# Patient Record
Sex: Female | Born: 1960
Health system: Southern US, Community
[De-identification: ages and names within clinical notes are randomized; demographics above are authoritative.]

## PROBLEM LIST (undated history)

## (undated) DIAGNOSIS — T7840XA Allergy, unspecified, initial encounter: Secondary | ICD-10-CM

## (undated) DIAGNOSIS — D649 Anemia, unspecified: Secondary | ICD-10-CM

## (undated) DIAGNOSIS — H409 Unspecified glaucoma: Secondary | ICD-10-CM

## (undated) HISTORY — DX: Unspecified glaucoma: H40.9

## (undated) HISTORY — DX: Anemia, unspecified: D64.9

## (undated) HISTORY — PX: MOUTH SURGERY: SHX715

## (undated) HISTORY — DX: Allergy, unspecified, initial encounter: T78.40XA

---

## 1994-02-12 HISTORY — PX: OOPHORECTOMY: SHX86

## 2002-02-12 HISTORY — PX: KNEE ARTHROSCOPY: SUR90

## 2003-06-04 ENCOUNTER — Other Ambulatory Visit: Admission: RE | Admit: 2003-06-04 | Discharge: 2003-06-04 | Payer: Self-pay | Admitting: Family Medicine

## 2003-06-08 ENCOUNTER — Encounter: Admission: RE | Admit: 2003-06-08 | Discharge: 2003-06-08 | Payer: Self-pay | Admitting: Family Medicine

## 2004-06-08 ENCOUNTER — Encounter: Admission: RE | Admit: 2004-06-08 | Discharge: 2004-06-08 | Payer: Self-pay | Admitting: Obstetrics and Gynecology

## 2004-07-06 ENCOUNTER — Other Ambulatory Visit: Admission: RE | Admit: 2004-07-06 | Discharge: 2004-07-06 | Payer: Self-pay | Admitting: Obstetrics and Gynecology

## 2005-04-30 ENCOUNTER — Ambulatory Visit: Payer: Self-pay | Admitting: Family Medicine

## 2005-06-11 ENCOUNTER — Encounter: Admission: RE | Admit: 2005-06-11 | Discharge: 2005-06-11 | Payer: Self-pay | Admitting: Obstetrics and Gynecology

## 2005-07-16 ENCOUNTER — Ambulatory Visit: Payer: Self-pay | Admitting: Family Medicine

## 2006-06-19 ENCOUNTER — Encounter: Admission: RE | Admit: 2006-06-19 | Discharge: 2006-06-19 | Payer: Self-pay | Admitting: Obstetrics and Gynecology

## 2006-12-23 ENCOUNTER — Ambulatory Visit: Payer: Self-pay | Admitting: Family Medicine

## 2006-12-23 DIAGNOSIS — J069 Acute upper respiratory infection, unspecified: Secondary | ICD-10-CM | POA: Insufficient documentation

## 2006-12-23 DIAGNOSIS — D649 Anemia, unspecified: Secondary | ICD-10-CM | POA: Insufficient documentation

## 2006-12-27 ENCOUNTER — Telehealth: Payer: Self-pay | Admitting: Family Medicine

## 2007-02-21 ENCOUNTER — Telehealth (INDEPENDENT_AMBULATORY_CARE_PROVIDER_SITE_OTHER): Payer: Self-pay | Admitting: *Deleted

## 2007-08-14 ENCOUNTER — Encounter: Admission: RE | Admit: 2007-08-14 | Discharge: 2007-08-14 | Payer: Self-pay | Admitting: Obstetrics and Gynecology

## 2007-09-30 ENCOUNTER — Ambulatory Visit: Payer: Self-pay | Admitting: Family Medicine

## 2007-09-30 LAB — CONVERTED CEMR LAB
Bilirubin Urine: NEGATIVE
Nitrite: NEGATIVE
Specific Gravity, Urine: 1.025
Urobilinogen, UA: 0.2

## 2007-10-01 LAB — CONVERTED CEMR LAB
ALT: 13 units/L (ref 0–35)
Albumin: 4.4 g/dL (ref 3.5–5.2)
BUN: 25 mg/dL — ABNORMAL HIGH (ref 6–23)
Basophils Relative: 1 % (ref 0.0–3.0)
Bilirubin, Direct: 0.1 mg/dL (ref 0.0–0.3)
CO2: 27 meq/L (ref 19–32)
Calcium: 9.2 mg/dL (ref 8.4–10.5)
Cholesterol: 172 mg/dL (ref 0–200)
Creatinine, Ser: 0.9 mg/dL (ref 0.4–1.2)
Eosinophils Relative: 2.7 % (ref 0.0–5.0)
Glucose, Bld: 90 mg/dL (ref 70–99)
HCT: 38.2 % (ref 36.0–46.0)
Hemoglobin: 13 g/dL (ref 12.0–15.0)
LDL Cholesterol: 95 mg/dL (ref 0–99)
Lymphocytes Relative: 30.2 % (ref 12.0–46.0)
Monocytes Relative: 8.3 % (ref 3.0–12.0)
Neutro Abs: 2.9 10*3/uL (ref 1.4–7.7)
RBC: 4.2 M/uL (ref 3.87–5.11)
TSH: 2.04 microintl units/mL (ref 0.35–5.50)
Total CHOL/HDL Ratio: 2.5
Total Protein: 7 g/dL (ref 6.0–8.3)

## 2008-07-01 ENCOUNTER — Telehealth (INDEPENDENT_AMBULATORY_CARE_PROVIDER_SITE_OTHER): Payer: Self-pay | Admitting: *Deleted

## 2008-09-30 ENCOUNTER — Encounter: Admission: RE | Admit: 2008-09-30 | Discharge: 2008-09-30 | Payer: Self-pay | Admitting: Obstetrics and Gynecology

## 2008-10-06 ENCOUNTER — Ambulatory Visit: Payer: Self-pay | Admitting: Family Medicine

## 2008-11-10 ENCOUNTER — Ambulatory Visit: Payer: Self-pay | Admitting: Family Medicine

## 2008-11-10 DIAGNOSIS — J019 Acute sinusitis, unspecified: Secondary | ICD-10-CM

## 2008-11-12 ENCOUNTER — Ambulatory Visit: Payer: Self-pay | Admitting: Family Medicine

## 2008-11-12 LAB — CONVERTED CEMR LAB
Glucose, Urine, Semiquant: NEGATIVE
Protein, U semiquant: NEGATIVE
WBC Urine, dipstick: NEGATIVE
pH: 7

## 2008-11-16 LAB — CONVERTED CEMR LAB
ALT: 15 units/L (ref 0–35)
AST: 20 units/L (ref 0–37)
Albumin: 4.4 g/dL (ref 3.5–5.2)
Basophils Relative: 0.3 % (ref 0.0–3.0)
Eosinophils Relative: 1.3 % (ref 0.0–5.0)
GFR calc non Af Amer: 80.85 mL/min (ref >60)
Glucose, Bld: 92 mg/dL (ref 70–99)
HCT: 37.3 % (ref 36.0–46.0)
Hemoglobin: 12.7 g/dL (ref 12.0–15.0)
Lymphs Abs: 1 10*3/uL (ref 0.7–4.0)
Monocytes Relative: 6.8 % (ref 3.0–12.0)
Neutro Abs: 2.9 10*3/uL (ref 1.4–7.7)
Potassium: 4.5 meq/L (ref 3.5–5.1)
RDW: 11.8 % (ref 11.5–14.6)
Sodium: 142 meq/L (ref 135–145)
TSH: 1.08 microintl units/mL (ref 0.35–5.50)
VLDL: 7.6 mg/dL (ref 0.0–40.0)
WBC: 4.3 10*3/uL — ABNORMAL LOW (ref 4.5–10.5)

## 2008-11-24 ENCOUNTER — Ambulatory Visit: Payer: Self-pay | Admitting: Family Medicine

## 2009-03-02 ENCOUNTER — Ambulatory Visit: Payer: Self-pay | Admitting: Family Medicine

## 2009-03-02 DIAGNOSIS — J309 Allergic rhinitis, unspecified: Secondary | ICD-10-CM | POA: Insufficient documentation

## 2009-04-06 ENCOUNTER — Ambulatory Visit: Payer: Self-pay | Admitting: Family Medicine

## 2009-04-07 ENCOUNTER — Telehealth: Payer: Self-pay | Admitting: Family Medicine

## 2010-03-14 NOTE — Assessment & Plan Note (Signed)
Summary: CONCERNS W/ SINUSITIS? // RS   Vital Signs:  Patient profile:   50 year old female Temp:     97.9 degrees F oral Pulse rate:   80 / minute BP sitting:   102 / 72  (left arm) Cuff size:   regular  Vitals Entered By: Alfred Levins, CMA (March 02, 2009 4:06 PM)  Contraindications/Deferment of Procedures/Staging:    Test/Procedure: Weight Refused    Reason for deferment: patient declined-cannot calculate BMI  CC: hx of sinus pressure ok now and wants to discuss a decongestant   History of Present Illness: Here for chronic stuffy nose and head congestion that have bothered her for several months. 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 She denies any HA or fever or ST or cough. tried many OTC oral meds but noithing helped. her main concern is not being able to breathe through the nose.   Current Medications (verified): 1)  Multivitamins   Tabs (Multiple Vitamin) .Marland Kitchen.. 1 By Mouth Once Daily  Allergies (verified): 1)  ! Penicillin 2)  ! Bactrim  Past History:  Past Medical History: Reviewed history from 12/23/2006 and no changes required. Anemia-NOS  Past Surgical History: Reviewed history from 12/23/2006 and no changes required. Oophorectomy left 1996  Review of Systems  The patient denies anorexia, fever, weight loss, weight gain, vision loss, decreased hearing, hoarseness, chest pain, syncope, dyspnea on exertion, peripheral edema, prolonged cough, headaches, hemoptysis, abdominal pain, melena, hematochezia, severe indigestion/heartburn, hematuria, incontinence, genital sores, muscle weakness, suspicious skin lesions, transient blindness, difficulty walking, depression, unusual weight change, abnormal bleeding, enlarged lymph nodes, angioedema, breast masses, and testicular masses.    Physical Exam  General:  Well-developed,well-nourished,in no acute distress; alert,appropriate and cooperative throughout  examination Head:  Normocephalic and atraumatic without obvious abnormalities. No apparent alopecia or balding. Eyes:  No corneal or conjunctival inflammation noted. EOMI. Perrla. Funduscopic exam benign, without hemorrhages, exudates or papilledema. Vision grossly normal. Ears:  External ear exam shows no significant lesions or deformities.  Otoscopic examination reveals clear canals, tympanic membranes are intact bilaterally without bulging, retraction, inflammation or discharge. Hearing is grossly normal bilaterally. Nose:  External nasal examination shows no deformity or inflammation. Nasal mucosa are pink and moist without lesions or exudates. Mouth:  Oral mucosa and oropharynx without lesions or exudates.  Teeth in good repair. Neck:  No deformities, masses, or tenderness noted.   Impression & Recommendations:  Problem # 1:  ALLERGIC RHINITIS (ICD-477.9)  Her updated medication list for this problem includes:    Nasonex 50 Mcg/act Susp (Mometasone furoate) .Marland Kitchen... 2 sprays each nostril once daily  Complete Medication List: 1)  Multivitamins Tabs (Multiple vitamin) .Marland Kitchen.. 1 by mouth once daily 2)  Nasonex 50 Mcg/act Susp (Mometasone furoate) .... 2 sprays each nostril once daily  Patient Instructions: 1)  Please schedule a follow-up appointment as needed .  Prescriptions: NASONEX 50 MCG/ACT SUSP (MOMETASONE FUROATE) 2 sprays each nostril once daily  #30 x 11   Entered and Authorized by:   Nelwyn Salisbury MD   Signed by:   Nelwyn Salisbury MD on 03/02/2009   Method used:   Electronically to        [facility]* (retail)       [address]       [place], [place]  [address]       Ph: [phone] or [phone]       Fax: [phone]  RxID:   [id number]

## 2010-03-14 NOTE — Assessment & Plan Note (Signed)
Summary: LARYNGITIS / ST // RS   Vital Signs:  Patient profile:   50 year old female Weight:      131 pounds Temp:     98.3 degrees F oral Pulse rate:   81 / minute BP sitting:   114 / 66  (left arm)  Vitals Entered By: Alfred Levins, CMA (April 06, 2009 3:14 PM) CC: laryngitis, xposed to pink eye   History of Present Illness: Here for 3 days of stuffy head, PND, hoarseness, a dry cough, and some eye irritation. No DC from the eyes. No fever.   Current Medications (verified): 1)  Multivitamins   Tabs (Multiple Vitamin) .Marland Kitchen.. 1 By Mouth Once Daily 2)  Nasonex 50 Mcg/act Susp (Mometasone Furoate) .... 2 Sprays Each Nostril Once Daily  Allergies: 1)  ! Penicillin 2)  ! Bactrim  Past History:  Past Medical History: Reviewed history from 12/23/2006 and no changes required. Anemia-NOS  Review of Systems  The patient denies anorexia, fever, weight loss, weight gain, vision loss, decreased hearing, hoarseness, chest pain, syncope, dyspnea on exertion, peripheral edema, headaches, hemoptysis, abdominal pain, melena, hematochezia, severe indigestion/heartburn, hematuria, incontinence, genital sores, muscle weakness, suspicious skin lesions, transient blindness, difficulty walking, depression, unusual weight change, abnormal bleeding, enlarged lymph nodes, angioedema, breast masses, and testicular masses.    Physical Exam  General:  Well-developed,well-nourished,in no acute distress; alert,appropriate and cooperative throughout examination Head:  Normocephalic and atraumatic without obvious abnormalities. No apparent alopecia or balding. Eyes:  No corneal or conjunctival inflammation noted. EOMI. Perrla. Funduscopic exam benign, without hemorrhages, exudates or papilledema. Vision grossly normal. Ears:  External ear exam shows no significant lesions or deformities.  Otoscopic examination reveals clear canals, tympanic membranes are intact bilaterally without bulging, retraction,  inflammation or discharge. Hearing is grossly normal bilaterally. Nose:  External nasal examination shows no deformity or inflammation. Nasal mucosa are pink and moist without lesions or exudates. Mouth:  Oral mucosa and oropharynx without lesions or exudates.  Teeth in good repair. Neck:  No deformities, masses, or tenderness noted. Lungs:  Normal respiratory effort, chest expands symmetrically. Lungs are clear to auscultation, no crackles or wheezes.   Impression & Recommendations:  Problem # 1:  VIRAL URI (ICD-465.9)  Complete Medication List: 1)  Multivitamins Tabs (Multiple vitamin) .Marland Kitchen.. 1 by mouth once daily 2)  Nasonex 50 Mcg/act Susp (Mometasone furoate) .... 2 sprays each nostril once daily  Patient Instructions: 1)  fluids, rest. Do not wear your contact lenses for several days.  2)  Please schedule a follow-up appointment as needed .

## 2010-03-14 NOTE — Progress Notes (Signed)
Summary: Call-A-Nurse Report   Call-A-Nurse Triage Call Report Triage Record Num: [id number] Operator: Edythe Lynn Patient Name: Shawnae Leiva Call Date & Time: 04/07/2009 7:47:07AM Patient Phone: [phone] PCP: Sherlie Ban Caller Name: Milinda Cave Relationship to Patient: Unknown Patient Gender: Female PCP Fax : [phone] Patient DOB: 01/11/1961 [facility] Name: [facility] Reason for Call: Pt says she saw her doctor yesterday for poss. pink eye/ was not. Says she woke up w/ eyes reddened and crusted over this am, slight swelling, no fever. No more drainage after cleaning eyes. Wants message sent to doctor and if MD thinks something needs to be called in. Allergic to PCN and Bactrim. Cannot call back at 0830. Says to call and leave message as to MD decision on her cell [phone] or [phone]. Protocol(s) Used: Office Note Recommended Outcome per Protocol: Information Noted and Sent to Office Reason for Outcome: Caller information to office Care Advice:  ~ 04/07/2009 7:55:26AM Page 1 of 1 [email or web address]  Appended Document: Call-A-Nurse Report call in Tobradex drops, place 2 drops in eyes q 4 hours as needed , 10 ml with no rf  Appended Document: Call-A-Nurse Report called Rx to [facility], pt informed

## 2010-11-13 ENCOUNTER — Other Ambulatory Visit (INDEPENDENT_AMBULATORY_CARE_PROVIDER_SITE_OTHER): Payer: 59

## 2010-11-13 DIAGNOSIS — Z0000 Encounter for general adult medical examination without abnormal findings: Secondary | ICD-10-CM

## 2010-11-13 LAB — POCT URINALYSIS DIPSTICK
Bilirubin, UA: NEGATIVE
Leukocytes, UA: NEGATIVE
Nitrite, UA: NEGATIVE
Protein, UA: NEGATIVE
Urobilinogen, UA: 0.2
pH, UA: 7

## 2010-11-13 LAB — HEPATIC FUNCTION PANEL
Alkaline Phosphatase: 56 U/L (ref 39–117)
Bilirubin, Direct: 0.1 mg/dL (ref 0.0–0.3)
Total Bilirubin: 0.6 mg/dL (ref 0.3–1.2)
Total Protein: 7.4 g/dL (ref 6.0–8.3)

## 2010-11-13 LAB — LIPID PANEL
HDL: 70.8 mg/dL (ref >39.00)
LDL Cholesterol: 91 mg/dL (ref 0–99)
Total CHOL/HDL Ratio: 3

## 2010-11-13 LAB — CBC WITH DIFFERENTIAL/PLATELET
Basophils Relative: 0.6 % (ref 0.0–3.0)
Eosinophils Absolute: 0.1 10*3/uL (ref 0.0–0.7)
Eosinophils Relative: 1.3 % (ref 0.0–5.0)
Lymphocytes Relative: 30.2 % (ref 12.0–46.0)
MCHC: 33.7 g/dL (ref 30.0–36.0)
Neutrophils Relative %: 61.4 % (ref 43.0–77.0)
RBC: 4.24 Mil/uL (ref 3.87–5.11)
WBC: 4.3 10*3/uL — ABNORMAL LOW (ref 4.5–10.5)

## 2010-11-13 LAB — BASIC METABOLIC PANEL
Calcium: 9.2 mg/dL (ref 8.4–10.5)
Creatinine, Ser: 1 mg/dL (ref 0.4–1.2)

## 2010-11-22 ENCOUNTER — Ambulatory Visit (INDEPENDENT_AMBULATORY_CARE_PROVIDER_SITE_OTHER): Payer: 59 | Admitting: Family Medicine

## 2010-11-22 ENCOUNTER — Telehealth: Payer: Self-pay | Admitting: Family Medicine

## 2010-11-22 ENCOUNTER — Encounter: Payer: Self-pay | Admitting: Family Medicine

## 2010-11-22 VITALS — BP 108/70 | HR 68 | Temp 98.9°F | Ht 63.0 in | Wt 130.0 lb

## 2010-11-22 DIAGNOSIS — Z23 Encounter for immunization: Secondary | ICD-10-CM

## 2010-11-22 DIAGNOSIS — Z0000 Encounter for general adult medical examination without abnormal findings: Secondary | ICD-10-CM

## 2010-11-22 DIAGNOSIS — Z136 Encounter for screening for cardiovascular disorders: Secondary | ICD-10-CM

## 2010-11-22 NOTE — Telephone Encounter (Signed)
Message copied by Baldemar Friday on Wed Nov 22, 2010  5:44 PM ------      Message from: Gershon Crane A      Created: Sun Nov 19, 2010  3:45 PM       Normal except elevated glucose. Watch the diet

## 2010-11-22 NOTE — Progress Notes (Signed)
  Subjective:    Patient ID: Mercedes [name], female    DOB: 1960/06/29, 50 y.o.   MRN: [id number]  HPI 50 yr old female for a cpx. She feels well and has no concerns.   Review of Systems  Constitutional: Negative.   HENT: Negative.   Eyes: Negative.   Respiratory: Negative.   Cardiovascular: Negative.   Gastrointestinal: Negative.   Genitourinary: Negative for dysuria, urgency, frequency, hematuria, flank pain, decreased urine volume, enuresis, difficulty urinating, pelvic pain and dyspareunia.  Musculoskeletal: Negative.   Skin: Negative.   Neurological: Negative.   Hematological: Negative.   Psychiatric/Behavioral: Negative.        Objective:   Physical Exam  Constitutional: She is oriented to person, place, and time. She appears well-developed and well-nourished. No distress.  HENT:  Head: Normocephalic and atraumatic.  Right Ear: External ear normal.  Left Ear: External ear normal.  Nose: Nose normal.  Mouth/Throat: Oropharynx is clear and moist. No oropharyngeal exudate.  Eyes: Conjunctivae and EOM are normal. Pupils are equal, round, and reactive to light. No scleral icterus.  Neck: Normal range of motion. Neck supple. No JVD present. No thyromegaly present.  Cardiovascular: Normal rate, regular rhythm, normal heart sounds and intact distal pulses.  Exam reveals no gallop and no friction rub.   No murmur heard.      EKG normal  Pulmonary/Chest: Effort normal and breath sounds normal. No respiratory distress. She has no wheezes. She has no rales. She exhibits no tenderness.  Abdominal: Soft. Bowel sounds are normal. She exhibits no distension and no mass. There is no tenderness. There is no rebound and no guarding.  Musculoskeletal: Normal range of motion. She exhibits no edema and no tenderness.  Lymphadenopathy:    She has no cervical adenopathy.  Neurological: She is alert and oriented to person, place, and time. She has normal reflexes. No cranial nerve deficit.  She exhibits normal muscle tone. Coordination normal.  Skin: Skin is warm and dry. No rash noted. No erythema.  Psychiatric: She has a normal mood and affect. Her behavior is normal. Judgment and thought content normal.          Assessment & Plan:  We discussed ways to keep her glucose down, such as by limiting her carbohydrate intake. We will check a BMET and A1c in 6 months. Also encouraged her to set up a colonoscopy soon

## 2010-11-22 NOTE — Telephone Encounter (Signed)
Left voice message with results.

## 2011-02-13 HISTORY — PX: OTHER SURGICAL HISTORY: SHX169

## 2011-05-01 ENCOUNTER — Telehealth: Payer: Self-pay | Admitting: Family Medicine

## 2011-05-01 NOTE — Telephone Encounter (Signed)
Pt is wanting to schedule a colonoscopy and said she Dr. Clent Ridges had referred her to a specific doctor but could not remember who. Pt husband sees Dr. Leone Payor and thinks that is the doctor but wanted to verify. Please contact

## 2011-05-03 NOTE — Telephone Encounter (Signed)
Left voice message.

## 2011-05-03 NOTE — Telephone Encounter (Signed)
Dr. Leone Payor would be fine for her to see

## 2011-05-08 ENCOUNTER — Encounter: Payer: Self-pay | Admitting: Internal Medicine

## 2011-06-28 ENCOUNTER — Ambulatory Visit (AMBULATORY_SURGERY_CENTER): Payer: 59 | Admitting: *Deleted

## 2011-06-28 VITALS — Ht 62.0 in | Wt 134.9 lb

## 2011-06-28 DIAGNOSIS — Z1211 Encounter for screening for malignant neoplasm of colon: Secondary | ICD-10-CM

## 2011-06-28 MED ORDER — PEG-KCL-NACL-NASULF-NA ASC-C 100 G PO SOLR: ORAL | Status: DC

## 2011-06-29 ENCOUNTER — Encounter: Payer: Self-pay | Admitting: Internal Medicine

## 2011-07-12 ENCOUNTER — Ambulatory Visit (AMBULATORY_SURGERY_CENTER): Payer: 59 | Admitting: Internal Medicine

## 2011-07-12 ENCOUNTER — Encounter: Payer: Self-pay | Admitting: Internal Medicine

## 2011-07-12 VITALS — BP 124/69 | HR 66 | Temp 96.4°F | Resp 18 | Ht 62.0 in | Wt 134.0 lb

## 2011-07-12 DIAGNOSIS — D126 Benign neoplasm of colon, unspecified: Secondary | ICD-10-CM

## 2011-07-12 DIAGNOSIS — Z1211 Encounter for screening for malignant neoplasm of colon: Secondary | ICD-10-CM

## 2011-07-12 HISTORY — PX: COLONOSCOPY: SHX174

## 2011-07-12 MED ORDER — SODIUM CHLORIDE 0.9 % IV SOLN: 500.0000 mL | INTRAVENOUS | Status: DC

## 2011-07-12 NOTE — Patient Instructions (Signed)
YOU HAD AN ENDOSCOPIC PROCEDURE TODAY AT THE [facility]: Refer to the procedure report that was given to you for any specific questions about what was found during the examination.  If the procedure report does not answer your questions, please call your gastroenterologist to clarify.  If you requested that your care partner not be given the details of your procedure findings, then the procedure report has been included in a sealed envelope for you to review at your convenience later.  YOU SHOULD EXPECT: Some feelings of bloating in the abdomen. Passage of more gas than usual.  Walking can help get rid of the air that was put into your GI tract during the procedure and reduce the bloating. If you had a lower endoscopy (such as a colonoscopy or flexible sigmoidoscopy) you may notice spotting of blood in your stool or on the toilet paper. If you underwent a bowel prep for your procedure, then you may not have a normal bowel movement for a few days.  DIET: Your first meal following the procedure should be a light meal and then it is ok to progress to your normal diet.  A half-sandwich or bowl of soup is an example of a good first meal.  Heavy or fried foods are harder to digest and may make you feel nauseous or bloated.  Likewise meals heavy in dairy and vegetables can cause extra gas to form and this can also increase the bloating.  Drink plenty of fluids but you should avoid alcoholic beverages for 24 hours.  ACTIVITY: Your care partner should take you home directly after the procedure.  You should plan to take it easy, moving slowly for the rest of the day.  You can resume normal activity the day after the procedure however you should NOT DRIVE or use heavy machinery for 24 hours (because of the sedation medicines used during the test).    SYMPTOMS TO REPORT IMMEDIATELY: A gastroenterologist can be reached at any hour.  During normal business hours, 8:30 AM to 5:00 PM Monday through Friday,  call [phone].  After hours and on weekends, please call the GI answering service at [phone] who will take a message and have the physician on call contact you.   Following lower endoscopy (colonoscopy or flexible sigmoidoscopy):  Excessive amounts of blood in the stool  Significant tenderness or worsening of abdominal pains  Swelling of the abdomen that is new, acute  Fever of 100F or higher   FOLLOW UP: If any biopsies were taken you will be contacted by phone or by letter within the next 1-3 weeks.  Call your gastroenterologist if you have not heard about the biopsies in 3 weeks.  Our staff will call the home number listed on your records the next business day following your procedure to check on you and address any questions or concerns that you may have at that time regarding the information given to you following your procedure. This is a courtesy call and so if there is no answer at the home number and we have not heard from you through the emergency physician on call, we will assume that you have returned to your regular daily activities without incident.  SIGNATURES/CONFIDENTIALITY: You and/or your care partner have signed paperwork which will be entered into your electronic medical record.  These signatures attest to the fact that that the information above on your After Visit Summary has been reviewed and is understood.  Full responsibility of the confidentiality of   this discharge information lies with you and/or your care-partner.   Resume medications. Information on polyps given with discharge instructions. 

## 2011-07-12 NOTE — Progress Notes (Signed)
Patient did not experience any of the following events: a burn prior to discharge; a fall within the facility; wrong site/side/patient/procedure/implant event; or a hospital transfer or hospital admission upon discharge from the facility. (G8907) Patient did not have preoperative order for IV antibiotic SSI prophylaxis. (G8918)  

## 2011-07-12 NOTE — Op Note (Signed)
[facility] [address] [place], [place]  [address]  COLONOSCOPY PROCEDURE REPORT  PATIENT:  Mercedes, [name]  MR#:  [id number] BIRTHDATE:  [birth year], 51 yrs. old  GENDER:  female ENDOSCOPIST:  Iva Boop, MD, [facility] REF. BY:  Tera Mater. Clent Ridges, M.D. PROCEDURE DATE:  07/12/2011 PROCEDURE:  Colonoscopy with snare polypectomy ASA CLASS:  Class I INDICATIONS:  Routine Risk Screening MEDICATIONS:   These medications were titrated to patient response per physician's verbal order, Fentanyl 100 mcg IV, Versed 7 mg IV  DESCRIPTION OF PROCEDURE:   After the risks benefits and alternatives of the procedure were thoroughly explained, informed consent was obtained.  Digital rectal exam was performed and revealed no abnormalities.   The LB [id number]-[id number] endoscope was introduced through the anus and advanced to the cecum, which was identified by both the appendix and ileocecal valve, without limitations.  The quality of the prep was excellent, using MoviPrep.  The instrument was then slowly withdrawn as the colon was fully examined. <<PROCEDUREIMAGES>>  FINDINGS:  A sessile polyp was found in the descending colon. Polyp was snared without cautery. Retrieval was successful. This was otherwise a normal examination of the colon. Includes right colon retroflexion.   Retroflexed views in the rectum revealed no abnormalities.    The time to cecum = 6:14 minutes. The scope was then withdrawn in 10:00 minutes from the cecum and the procedure completed. COMPLICATIONS:  None ENDOSCOPIC IMPRESSION: 1) Sessile polyp (6mm) removed from the descending colon 2) Otherwise normal examination with excellent prep  REPEAT EXAM:  In for Colonoscopy, pending biopsy results.  Iva Boop, MD, [facility]  CC:  Nelwyn Salisbury, MD and The Patient  n. Rosalie Doctor:   Iva Boop at 07/12/2011 10:16 AM  Milinda Cave, [phone]

## 2011-07-13 ENCOUNTER — Telehealth: Payer: Self-pay | Admitting: *Deleted

## 2011-07-13 NOTE — Telephone Encounter (Signed)
  Follow up Call-  Call back number 07/12/2011  Post procedure Call Back phone  # [phone]  Permission to leave phone message Yes     Patient questions:  Do you have a fever, pain , or abdominal swelling? no Pain Score  0 *  Have you tolerated food without any problems? yes  Have you been able to return to your normal activities? yes  Do you have any questions about your discharge instructions: Diet   no Medications  no Follow up visit  no  Do you have questions or concerns about your Care? no  Actions: * If pain score is 4 or above: No action needed, pain <4.

## 2011-07-17 ENCOUNTER — Encounter: Payer: Self-pay | Admitting: Internal Medicine

## 2011-07-17 DIAGNOSIS — Z860100 Personal history of colon polyps, unspecified: Secondary | ICD-10-CM | POA: Insufficient documentation

## 2011-07-17 NOTE — Progress Notes (Signed)
Quick Note:  6 mm adenoma, repeat colonoscopy in about 5 years, 2018 ______

## 2013-04-22 ENCOUNTER — Telehealth: Payer: Self-pay | Admitting: Family Medicine

## 2013-04-22 NOTE — Telephone Encounter (Signed)
Pt is having a grandbaby and wants to know to know if she is up to date on her tdap. pls asvise if I need to schedule.

## 2013-04-23 NOTE — Telephone Encounter (Signed)
Pt had a Tdap on 06/12/11, it is good for 10 years. I called pt and left a voice message with this information.

## 2013-07-01 ENCOUNTER — Other Ambulatory Visit: Payer: Self-pay | Admitting: Obstetrics and Gynecology

## 2014-04-15 ENCOUNTER — Other Ambulatory Visit: Payer: Self-pay | Admitting: Family Medicine

## 2014-04-15 NOTE — Telephone Encounter (Signed)
Do you prescribe this and if so can we refill?

## 2014-08-05 ENCOUNTER — Other Ambulatory Visit: Payer: Self-pay | Admitting: Obstetrics and Gynecology

## 2014-08-06 LAB — CYTOLOGY - PAP

## 2014-08-10 ENCOUNTER — Other Ambulatory Visit: Payer: Self-pay | Admitting: Obstetrics and Gynecology

## 2014-08-10 DIAGNOSIS — R928 Other abnormal and inconclusive findings on diagnostic imaging of breast: Secondary | ICD-10-CM

## 2014-08-18 ENCOUNTER — Other Ambulatory Visit: Payer: Self-pay | Admitting: Obstetrics and Gynecology

## 2014-08-18 ENCOUNTER — Ambulatory Visit
Admission: RE | Admit: 2014-08-18 | Discharge: 2014-08-18 | Disposition: A | Discharge status: Home / Self Care | Payer: BLUE CROSS/BLUE SHIELD | Source: Ambulatory Visit | Attending: Obstetrics and Gynecology | Admitting: Obstetrics and Gynecology

## 2014-08-18 DIAGNOSIS — R928 Other abnormal and inconclusive findings on diagnostic imaging of breast: Secondary | ICD-10-CM

## 2014-08-27 ENCOUNTER — Ambulatory Visit
Admission: RE | Admit: 2014-08-27 | Discharge: 2014-08-27 | Disposition: A | Discharge status: Home / Self Care | Payer: BLUE CROSS/BLUE SHIELD | Source: Ambulatory Visit | Attending: Obstetrics and Gynecology | Admitting: Obstetrics and Gynecology

## 2014-08-27 DIAGNOSIS — R928 Other abnormal and inconclusive findings on diagnostic imaging of breast: Secondary | ICD-10-CM

## 2014-10-07 ENCOUNTER — Telehealth: Payer: Self-pay | Admitting: Family Medicine

## 2014-10-07 NOTE — Telephone Encounter (Signed)
Pt not seen since 2012, has been going to OBGYN.  Pt would like to est back w/ You and get a cpe. Will you accept her back?

## 2014-10-07 NOTE — Telephone Encounter (Signed)
Yes I can see her again  

## 2014-10-12 NOTE — Telephone Encounter (Signed)
Pt was scheduled

## 2014-12-02 ENCOUNTER — Other Ambulatory Visit: Payer: BLUE CROSS/BLUE SHIELD

## 2014-12-02 ENCOUNTER — Encounter: Payer: Self-pay | Admitting: Family Medicine

## 2014-12-02 ENCOUNTER — Ambulatory Visit (INDEPENDENT_AMBULATORY_CARE_PROVIDER_SITE_OTHER): Payer: BLUE CROSS/BLUE SHIELD | Admitting: Family Medicine

## 2014-12-02 VITALS — BP 122/90 | HR 83 | Temp 98.6°F | Resp 14 | Ht 62.5 in | Wt 153.6 lb

## 2014-12-02 DIAGNOSIS — Z0000 Encounter for general adult medical examination without abnormal findings: Secondary | ICD-10-CM

## 2014-12-02 DIAGNOSIS — R8290 Unspecified abnormal findings in urine: Secondary | ICD-10-CM

## 2014-12-02 LAB — CBC WITH DIFFERENTIAL/PLATELET
Basophils Absolute: 0 10*3/uL (ref 0.0–0.1)
Basophils Relative: 0.6 % (ref 0.0–3.0)
EOS PCT: 0.9 % (ref 0.0–5.0)
Eosinophils Absolute: 0.1 10*3/uL (ref 0.0–0.7)
HEMATOCRIT: 41.2 % (ref 36.0–46.0)
HEMOGLOBIN: 13.8 g/dL (ref 12.0–15.0)
Lymphocytes Relative: 23.3 % (ref 12.0–46.0)
Lymphs Abs: 1.3 10*3/uL (ref 0.7–4.0)
MCHC: 33.4 g/dL (ref 30.0–36.0)
MCV: 87.9 fl (ref 78.0–100.0)
MONOS PCT: 6.2 % (ref 3.0–12.0)
Monocytes Absolute: 0.3 10*3/uL (ref 0.1–1.0)
Neutro Abs: 3.9 10*3/uL (ref 1.4–7.7)
Neutrophils Relative %: 69 % (ref 43.0–77.0)
Platelets: 312 10*3/uL (ref 150.0–400.0)
RBC: 4.68 Mil/uL (ref 3.87–5.11)
RDW: 13.3 % (ref 11.5–15.5)
WBC: 5.6 10*3/uL (ref 4.0–10.5)

## 2014-12-02 LAB — HEPATIC FUNCTION PANEL
ALBUMIN: 4.8 g/dL (ref 3.5–5.2)
ALT: 16 U/L (ref 0–35)
AST: 17 U/L (ref 0–37)
Alkaline Phosphatase: 62 U/L (ref 39–117)
Bilirubin, Direct: 0.1 mg/dL (ref 0.0–0.3)
TOTAL PROTEIN: 7.7 g/dL (ref 6.0–8.3)
Total Bilirubin: 0.7 mg/dL (ref 0.2–1.2)

## 2014-12-02 LAB — LIPID PANEL
Cholesterol: 154 mg/dL (ref 0–200)
HDL: 66.1 mg/dL (ref >39.00)
LDL Cholesterol: 72 mg/dL (ref 0–99)
NONHDL: 87.99
Total CHOL/HDL Ratio: 2
Triglycerides: 81 mg/dL (ref 0.0–149.0)
VLDL: 16.2 mg/dL (ref 0.0–40.0)

## 2014-12-02 LAB — POCT URINALYSIS DIPSTICK
Bilirubin, UA: NEGATIVE
GLUCOSE UA: NEGATIVE
Ketones, UA: NEGATIVE
LEUKOCYTES UA: NEGATIVE
NITRITE UA: NEGATIVE
PH UA: 6
Protein, UA: NEGATIVE
Spec Grav, UA: <=1.005
UROBILINOGEN UA: 0.2

## 2014-12-02 LAB — BASIC METABOLIC PANEL
BUN: 19 mg/dL (ref 6–23)
CHLORIDE: 107 meq/L (ref 96–112)
CO2: 27 mEq/L (ref 19–32)
Calcium: 9.7 mg/dL (ref 8.4–10.5)
Creatinine, Ser: 0.81 mg/dL (ref 0.40–1.20)
GFR: 77.86 mL/min (ref >60.00)
Glucose, Bld: 95 mg/dL (ref 70–99)
POTASSIUM: 4.3 meq/L (ref 3.5–5.1)
SODIUM: 144 meq/L (ref 135–145)

## 2014-12-02 LAB — TSH: TSH: 1.91 u[IU]/mL (ref 0.35–4.50)

## 2014-12-02 NOTE — Progress Notes (Signed)
Pre visit review using our clinic review tool, if applicable. No additional management support is needed unless otherwise documented below in the visit note. 

## 2014-12-02 NOTE — Progress Notes (Signed)
   Subjective:    Patient ID: Mercedes [name], female    DOB: May 24, 1960, 54 y.o.   MRN: [id number]  HPI 54 yr old female to re-establish wit [place] and for a cpx. She was last seen here 4 years ago. She is doing well in general though she struggles to keep her weight down. She had a dental implant performed by Dr. Clovis Riley last year. She see Dr. Helane Rima for GYN exams. She was found to have a mass in the left breast and a biopsy proved this to be a benign intraductal papilloma. She is following this with Dr. Dalbert Batman, and they are debating about whether to remove this surgically at some point. She walks 3 days a week.   Review of Systems  Constitutional: Negative.   HENT: Negative.   Eyes: Negative.   Respiratory: Negative.   Cardiovascular: Negative.   Gastrointestinal: Negative.   Genitourinary: Negative for dysuria, urgency, frequency, hematuria, flank pain, decreased urine volume, enuresis, difficulty urinating, pelvic pain and dyspareunia.  Musculoskeletal: Negative.   Skin: Negative.   Neurological: Negative.   Psychiatric/Behavioral: Negative.        Objective:   Physical Exam  Constitutional: She is oriented to person, place, and time. She appears well-developed and well-nourished. No distress.  HENT:  Head: Normocephalic and atraumatic.  Right Ear: External ear normal.  Left Ear: External ear normal.  Nose: Nose normal.  Mouth/Throat: Oropharynx is clear and moist. No oropharyngeal exudate.  Eyes: Conjunctivae and EOM are normal. Pupils are equal, round, and reactive to light. No scleral icterus.  Neck: Normal range of motion. Neck supple. No JVD present. No thyromegaly present.  Cardiovascular: Normal rate, regular rhythm, normal heart sounds and intact distal pulses.  Exam reveals no gallop and no friction rub.   No murmur heard. Pulmonary/Chest: Effort normal and breath sounds normal. No respiratory distress. She has no wheezes. She has no rales. She exhibits no tenderness.    Abdominal: Soft. Bowel sounds are normal. She exhibits no distension and no mass. There is no tenderness. There is no rebound and no guarding.  Musculoskeletal: Normal range of motion. She exhibits no edema or tenderness.  Lymphadenopathy:    She has no cervical adenopathy.  Neurological: She is alert and oriented to person, place, and time. She has normal reflexes. No cranial nerve deficit. She exhibits normal muscle tone. Coordination normal.  Skin: Skin is warm and dry. No rash noted. No erythema.  Psychiatric: She has a normal mood and affect. Her behavior is normal. Judgment and thought content normal.          Assessment & Plan:  Well exam. Get fasting labs today. We discussed diet advice and I advised her to walk 5-6 days a week.

## 2015-02-13 HISTORY — PX: BREAST BIOPSY: SHX20

## 2015-02-22 ENCOUNTER — Other Ambulatory Visit: Payer: Self-pay | Admitting: General Surgery

## 2015-02-22 DIAGNOSIS — R921 Mammographic calcification found on diagnostic imaging of breast: Secondary | ICD-10-CM

## 2015-03-21 ENCOUNTER — Ambulatory Visit
Admission: RE | Admit: 2015-03-21 | Discharge: 2015-03-21 | Disposition: A | Discharge status: Home / Self Care | Payer: BLUE CROSS/BLUE SHIELD | Source: Ambulatory Visit | Attending: General Surgery | Admitting: General Surgery

## 2015-03-21 DIAGNOSIS — R921 Mammographic calcification found on diagnostic imaging of breast: Secondary | ICD-10-CM

## 2015-04-05 ENCOUNTER — Ambulatory Visit (INDEPENDENT_AMBULATORY_CARE_PROVIDER_SITE_OTHER): Payer: BLUE CROSS/BLUE SHIELD | Admitting: Family Medicine

## 2015-04-05 ENCOUNTER — Encounter: Payer: Self-pay | Admitting: Family Medicine

## 2015-04-05 VITALS — BP 107/70 | HR 79 | Temp 98.9°F | Ht 62.5 in | Wt 154.0 lb

## 2015-04-05 DIAGNOSIS — Z91048 Other nonmedicinal substance allergy status: Secondary | ICD-10-CM | POA: Diagnosis not present

## 2015-04-05 DIAGNOSIS — Z9109 Other allergy status, other than to drugs and biological substances: Secondary | ICD-10-CM

## 2015-04-05 NOTE — Progress Notes (Signed)
Pre visit review using our clinic review tool, if applicable. No additional management support is needed unless otherwise documented below in the visit note. 

## 2015-04-05 NOTE — Progress Notes (Signed)
   Subjective:    Patient ID: Mercedes [name], female    DOB: Aug 27, 1960, 55 y.o.   MRN: [id number]  HPI Here to discuss recent symptoms of ear pressure, sinus congestion, and stuffy nose. No cough or fever or ST. She did have a right ear infection in December and she went to Urgent Care. She was treated with a Zpack. She has used Zyrtec and Flonase off and on but never consistently.    Review of Systems  Constitutional: Negative.   HENT: Positive for congestion and ear pain.   Eyes: Negative.   Respiratory: Negative.        Objective:   Physical Exam  Constitutional: She appears well-developed and well-nourished.  HENT:  Right Ear: External ear normal.  Left Ear: External ear normal.  Nose: Nose normal.  Mouth/Throat: Oropharynx is clear and moist.  Eyes: Conjunctivae are normal.  Neck: No thyromegaly present.  Pulmonary/Chest: Effort normal and breath sounds normal.  Lymphadenopathy:    She has no cervical adenopathy.          Assessment & Plan:  Her ear infection has resolved. She has chronic allergies and I recommended she use Zyrtec and Flonase every day.

## 2015-06-27 DIAGNOSIS — R6883 Chills (without fever): Secondary | ICD-10-CM | POA: Diagnosis not present

## 2015-06-27 DIAGNOSIS — J101 Influenza due to other identified influenza virus with other respiratory manifestations: Secondary | ICD-10-CM | POA: Diagnosis not present

## 2015-06-27 DIAGNOSIS — R5383 Other fatigue: Secondary | ICD-10-CM | POA: Diagnosis not present

## 2015-06-27 DIAGNOSIS — R52 Pain, unspecified: Secondary | ICD-10-CM | POA: Diagnosis not present

## 2015-06-27 DIAGNOSIS — R05 Cough: Secondary | ICD-10-CM | POA: Diagnosis not present

## 2015-07-13 DIAGNOSIS — L814 Other melanin hyperpigmentation: Secondary | ICD-10-CM | POA: Diagnosis not present

## 2015-07-13 DIAGNOSIS — L570 Actinic keratosis: Secondary | ICD-10-CM | POA: Diagnosis not present

## 2015-07-13 DIAGNOSIS — D2262 Melanocytic nevi of left upper limb, including shoulder: Secondary | ICD-10-CM | POA: Diagnosis not present

## 2015-08-05 ENCOUNTER — Emergency Department (HOSPITAL_COMMUNITY)
Admission: EM | Admit: 2015-08-05 | Discharge: 2015-08-05 | Disposition: A | Discharge status: Home / Self Care | Payer: BLUE CROSS/BLUE SHIELD | Attending: Emergency Medicine | Admitting: Emergency Medicine

## 2015-08-05 ENCOUNTER — Emergency Department (HOSPITAL_COMMUNITY): Payer: BLUE CROSS/BLUE SHIELD

## 2015-08-05 ENCOUNTER — Encounter (HOSPITAL_COMMUNITY): Payer: Self-pay | Admitting: *Deleted

## 2015-08-05 DIAGNOSIS — T148 Other injury of unspecified body region: Secondary | ICD-10-CM | POA: Diagnosis not present

## 2015-08-05 DIAGNOSIS — Y92410 Unspecified street and highway as the place of occurrence of the external cause: Secondary | ICD-10-CM | POA: Diagnosis not present

## 2015-08-05 DIAGNOSIS — Y999 Unspecified external cause status: Secondary | ICD-10-CM | POA: Diagnosis not present

## 2015-08-05 DIAGNOSIS — M7989 Other specified soft tissue disorders: Secondary | ICD-10-CM | POA: Diagnosis not present

## 2015-08-05 DIAGNOSIS — Z79899 Other long term (current) drug therapy: Secondary | ICD-10-CM | POA: Diagnosis not present

## 2015-08-05 DIAGNOSIS — S40811A Abrasion of right upper arm, initial encounter: Secondary | ICD-10-CM

## 2015-08-05 DIAGNOSIS — S50811A Abrasion of right forearm, initial encounter: Secondary | ICD-10-CM | POA: Insufficient documentation

## 2015-08-05 DIAGNOSIS — S59911A Unspecified injury of right forearm, initial encounter: Secondary | ICD-10-CM | POA: Diagnosis not present

## 2015-08-05 DIAGNOSIS — Z791 Long term (current) use of non-steroidal anti-inflammatories (NSAID): Secondary | ICD-10-CM | POA: Insufficient documentation

## 2015-08-05 DIAGNOSIS — Y939 Activity, unspecified: Secondary | ICD-10-CM | POA: Insufficient documentation

## 2015-08-05 DIAGNOSIS — M79641 Pain in right hand: Secondary | ICD-10-CM | POA: Diagnosis not present

## 2015-08-05 MED ORDER — METHOCARBAMOL 500 MG PO TABS: 500.0000 mg | ORAL_TABLET | Freq: Two times a day (BID) | ORAL | Status: DC

## 2015-08-05 MED ORDER — NAPROXEN 500 MG PO TABS: 500.0000 mg | ORAL_TABLET | Freq: Two times a day (BID) | ORAL | Status: DC

## 2015-08-05 MED ORDER — TETANUS-DIPHTH-ACELL PERTUSSIS 5-2.5-18.5 LF-MCG/0.5 IM SUSP
0.5000 mL | Freq: Once | INTRAMUSCULAR | Status: AC
  Administered 2015-08-05: 0.5 mL via INTRAMUSCULAR
  Filled 2015-08-05: qty 0.5
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
## 2015-08-05 NOTE — ED Notes (Signed)
The pts rt forearm has an abrasion on it.  Cleaned with soap and water and an adaptic dressing applied with kling
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
## 2015-08-05 NOTE — ED Notes (Signed)
The pa saw before myself  See his notes and thew triage rns notes.  Alert  No distress
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
## 2015-08-22 DIAGNOSIS — Z6826 Body mass index (BMI) 26.0-26.9, adult: Secondary | ICD-10-CM | POA: Diagnosis not present

## 2015-08-22 DIAGNOSIS — Z1231 Encounter for screening mammogram for malignant neoplasm of breast: Secondary | ICD-10-CM | POA: Diagnosis not present

## 2015-08-22 DIAGNOSIS — Z01419 Encounter for gynecological examination (general) (routine) without abnormal findings: Secondary | ICD-10-CM | POA: Diagnosis not present

## 2015-08-22 DIAGNOSIS — Z1212 Encounter for screening for malignant neoplasm of rectum: Secondary | ICD-10-CM | POA: Diagnosis not present

## 2015-10-04 ENCOUNTER — Other Ambulatory Visit: Payer: Self-pay | Admitting: Family Medicine

## 2015-10-06 NOTE — Telephone Encounter (Signed)
Rx refill sent to pharmacy. 

## 2015-10-24 DIAGNOSIS — Z23 Encounter for immunization: Secondary | ICD-10-CM | POA: Diagnosis not present

## 2015-10-24 DIAGNOSIS — M25561 Pain in right knee: Secondary | ICD-10-CM | POA: Diagnosis not present

## 2015-11-01 DIAGNOSIS — N390 Urinary tract infection, site not specified: Secondary | ICD-10-CM | POA: Diagnosis not present

## 2015-11-01 DIAGNOSIS — R319 Hematuria, unspecified: Secondary | ICD-10-CM | POA: Diagnosis not present

## 2015-11-01 DIAGNOSIS — R350 Frequency of micturition: Secondary | ICD-10-CM | POA: Diagnosis not present

## 2015-11-07 ENCOUNTER — Other Ambulatory Visit: Payer: Self-pay | Admitting: Specialist

## 2015-11-07 DIAGNOSIS — M25561 Pain in right knee: Secondary | ICD-10-CM

## 2015-11-14 ENCOUNTER — Other Ambulatory Visit: Payer: Self-pay | Admitting: Family Medicine

## 2015-11-14 NOTE — Telephone Encounter (Signed)
Refill request for Fluticasone 50 mcg and a 90 day supply quantity 48.0 send to [facility].

## 2015-11-15 ENCOUNTER — Other Ambulatory Visit: Payer: Self-pay | Admitting: Family Medicine

## 2015-11-15 NOTE — Telephone Encounter (Signed)
Refill request for Fluticasone 50 mcg spray and a 90 day supply to [facility].

## 2015-11-15 NOTE — Telephone Encounter (Signed)
This is a duplicate request

## 2015-11-16 MED ORDER — FLUTICASONE PROPIONATE 50 MCG/ACT NA SUSP: NASAL | 1 refills | Status: DC

## 2015-11-16 NOTE — Telephone Encounter (Signed)
I sent script e-scribe for a 90 day supply to below pharmacy.

## 2015-12-02 ENCOUNTER — Telehealth: Payer: Self-pay | Admitting: Family Medicine

## 2015-12-02 MED ORDER — CIPROFLOXACIN HCL 500 MG PO TABS: 500.0000 mg | ORAL_TABLET | Freq: Two times a day (BID) | ORAL | 0 refills | Status: DC

## 2015-12-02 NOTE — Telephone Encounter (Signed)
Pt is coming in on morning and having cpx labs. Pt just want to make md aware she had uti a month ago and now having symptoms again. Pt will have urine test also on Monday. Please advise

## 2015-12-02 NOTE — Telephone Encounter (Signed)
I spoke pt , she recently had a UTI and was given Cipro, she finished that and now having urgency to urinate. Per Dr. Sarajane Jews send in Cipro 500 mg take 1 po bid times 7 days.

## 2015-12-02 NOTE — Telephone Encounter (Signed)
I sent script e-scribe to [facility] and spoke with pt.  

## 2015-12-02 NOTE — Telephone Encounter (Signed)
I left a voice message for pt to return my call. I need more information, like what type of symptoms? Also there is 2 pharmacies in chart, which one if we can send something in?

## 2015-12-05 ENCOUNTER — Other Ambulatory Visit (INDEPENDENT_AMBULATORY_CARE_PROVIDER_SITE_OTHER): Payer: BLUE CROSS/BLUE SHIELD

## 2015-12-05 DIAGNOSIS — Z0000 Encounter for general adult medical examination without abnormal findings: Secondary | ICD-10-CM | POA: Diagnosis not present

## 2015-12-05 LAB — LIPID PANEL
CHOLESTEROL: 162 mg/dL (ref 0–200)
HDL: 67.9 mg/dL (ref >39.00)
LDL CALC: 80 mg/dL (ref 0–99)
NonHDL: 94.05
TRIGLYCERIDES: 71 mg/dL (ref 0.0–149.0)
Total CHOL/HDL Ratio: 2
VLDL: 14.2 mg/dL (ref 0.0–40.0)

## 2015-12-05 LAB — POC URINALSYSI DIPSTICK (AUTOMATED)
BILIRUBIN UA: NEGATIVE
GLUCOSE UA: NEGATIVE
KETONES UA: NEGATIVE
LEUKOCYTES UA: NEGATIVE
Nitrite, UA: NEGATIVE
PROTEIN UA: NEGATIVE
Spec Grav, UA: 1.01
Urobilinogen, UA: 0.2
pH, UA: 7

## 2015-12-05 LAB — BASIC METABOLIC PANEL
BUN: 18 mg/dL (ref 6–23)
CHLORIDE: 105 meq/L (ref 96–112)
CO2: 27 meq/L (ref 19–32)
CREATININE: 0.77 mg/dL (ref 0.40–1.20)
Calcium: 9.3 mg/dL (ref 8.4–10.5)
GFR: 82.24 mL/min (ref >60.00)
GLUCOSE: 101 mg/dL — AB (ref 70–99)
Potassium: 4.6 mEq/L (ref 3.5–5.1)
Sodium: 139 mEq/L (ref 135–145)

## 2015-12-05 LAB — HEPATIC FUNCTION PANEL
ALT: 13 U/L (ref 0–35)
AST: 16 U/L (ref 0–37)
Albumin: 4.6 g/dL (ref 3.5–5.2)
Alkaline Phosphatase: 69 U/L (ref 39–117)
BILIRUBIN DIRECT: 0.1 mg/dL (ref 0.0–0.3)
BILIRUBIN TOTAL: 0.6 mg/dL (ref 0.2–1.2)
Total Protein: 6.7 g/dL (ref 6.0–8.3)

## 2015-12-05 LAB — CBC WITH DIFFERENTIAL/PLATELET
BASOS PCT: 0.4 % (ref 0.0–3.0)
Basophils Absolute: 0 10*3/uL (ref 0.0–0.1)
EOS ABS: 0.1 10*3/uL (ref 0.0–0.7)
EOS PCT: 1.9 % (ref 0.0–5.0)
HCT: 38.2 % (ref 36.0–46.0)
Hemoglobin: 13.1 g/dL (ref 12.0–15.0)
LYMPHS ABS: 1.3 10*3/uL (ref 0.7–4.0)
Lymphocytes Relative: 25.3 % (ref 12.0–46.0)
MCHC: 34.4 g/dL (ref 30.0–36.0)
MCV: 87.5 fl (ref 78.0–100.0)
MONO ABS: 0.3 10*3/uL (ref 0.1–1.0)
Monocytes Relative: 5.1 % (ref 3.0–12.0)
NEUTROS PCT: 67.3 % (ref 43.0–77.0)
Neutro Abs: 3.6 10*3/uL (ref 1.4–7.7)
PLATELETS: 275 10*3/uL (ref 150.0–400.0)
RBC: 4.37 Mil/uL (ref 3.87–5.11)
RDW: 14 % (ref 11.5–15.5)
WBC: 5.3 10*3/uL (ref 4.0–10.5)

## 2015-12-05 LAB — TSH: TSH: 2.9 u[IU]/mL (ref 0.35–4.50)

## 2015-12-12 ENCOUNTER — Encounter: Payer: BLUE CROSS/BLUE SHIELD | Admitting: Family Medicine

## 2016-01-02 ENCOUNTER — Ambulatory Visit (INDEPENDENT_AMBULATORY_CARE_PROVIDER_SITE_OTHER): Payer: BLUE CROSS/BLUE SHIELD | Admitting: Family Medicine

## 2016-01-02 ENCOUNTER — Encounter: Payer: Self-pay | Admitting: Family Medicine

## 2016-01-02 VITALS — BP 114/75 | HR 71 | Temp 99.0°F | Ht 62.0 in | Wt 150.0 lb

## 2016-01-02 DIAGNOSIS — Z0000 Encounter for general adult medical examination without abnormal findings: Secondary | ICD-10-CM | POA: Diagnosis not present

## 2016-01-02 NOTE — Progress Notes (Signed)
Pre visit review using our clinic review tool, if applicable. No additional management support is needed unless otherwise documented below in the visit note. 

## 2016-01-02 NOTE — Progress Notes (Signed)
   Subjective:    Patient ID: Mercedes [name], female    DOB: 10-Mar-1960, 55 y.o.   MRN: [id number]  HPI 55 yr old female for a well exam. She feels well. She was treated for a UTI a month ago and this has resolved.    Review of Systems  Constitutional: Negative.   HENT: Negative.   Eyes: Negative.   Respiratory: Negative.   Cardiovascular: Negative.   Gastrointestinal: Negative.   Genitourinary: Negative for decreased urine volume, difficulty urinating, dyspareunia, dysuria, enuresis, flank pain, frequency, hematuria, pelvic pain and urgency.  Musculoskeletal: Negative.   Skin: Negative.   Neurological: Negative.   Psychiatric/Behavioral: Negative.        Objective:   Physical Exam  Constitutional: She is oriented to person, place, and time. She appears well-developed and well-nourished. No distress.  HENT:  Head: Normocephalic and atraumatic.  Right Ear: External ear normal.  Left Ear: External ear normal.  Nose: Nose normal.  Mouth/Throat: Oropharynx is clear and moist. No oropharyngeal exudate.  Eyes: Conjunctivae and EOM are normal. Pupils are equal, round, and reactive to light. No scleral icterus.  Neck: Normal range of motion. Neck supple. No JVD present. No thyromegaly present.  Cardiovascular: Normal rate, regular rhythm, normal heart sounds and intact distal pulses.  Exam reveals no gallop and no friction rub.   No murmur heard. Pulmonary/Chest: Effort normal and breath sounds normal. No respiratory distress. She has no wheezes. She has no rales. She exhibits no tenderness.  Abdominal: Soft. Bowel sounds are normal. She exhibits no distension and no mass. There is no tenderness. There is no rebound and no guarding.  Musculoskeletal: Normal range of motion. She exhibits no edema or tenderness.  Lymphadenopathy:    She has no cervical adenopathy.  Neurological: She is alert and oriented to person, place, and time. She has normal reflexes. No cranial nerve deficit.  She exhibits normal muscle tone. Coordination normal.  Skin: Skin is warm and dry. No rash noted. No erythema.  Psychiatric: She has a normal mood and affect. Her behavior is normal. Judgment and thought content normal.          Assessment & Plan:  Well exam. We discussed diet and exercise.  [name], MD

## 2016-03-21 DIAGNOSIS — A084 Viral intestinal infection, unspecified: Secondary | ICD-10-CM | POA: Diagnosis not present

## 2016-08-21 ENCOUNTER — Encounter: Payer: Self-pay | Admitting: Internal Medicine

## 2016-09-20 DIAGNOSIS — J301 Allergic rhinitis due to pollen: Secondary | ICD-10-CM | POA: Diagnosis not present

## 2016-09-20 DIAGNOSIS — J029 Acute pharyngitis, unspecified: Secondary | ICD-10-CM | POA: Diagnosis not present

## 2016-10-16 ENCOUNTER — Other Ambulatory Visit: Payer: Self-pay | Admitting: Family Medicine

## 2016-11-13 DIAGNOSIS — Z1231 Encounter for screening mammogram for malignant neoplasm of breast: Secondary | ICD-10-CM | POA: Diagnosis not present

## 2016-11-13 DIAGNOSIS — Z6828 Body mass index (BMI) 28.0-28.9, adult: Secondary | ICD-10-CM | POA: Diagnosis not present

## 2016-11-13 DIAGNOSIS — Z13820 Encounter for screening for osteoporosis: Secondary | ICD-10-CM | POA: Diagnosis not present

## 2016-11-13 DIAGNOSIS — Z01419 Encounter for gynecological examination (general) (routine) without abnormal findings: Secondary | ICD-10-CM | POA: Diagnosis not present

## 2016-11-15 ENCOUNTER — Other Ambulatory Visit: Payer: Self-pay | Admitting: Obstetrics and Gynecology

## 2016-11-15 DIAGNOSIS — R928 Other abnormal and inconclusive findings on diagnostic imaging of breast: Secondary | ICD-10-CM

## 2016-11-27 ENCOUNTER — Ambulatory Visit
Admission: RE | Admit: 2016-11-27 | Discharge: 2016-11-27 | Disposition: A | Discharge status: Home / Self Care | Payer: BLUE CROSS/BLUE SHIELD | Source: Ambulatory Visit | Attending: Obstetrics and Gynecology | Admitting: Obstetrics and Gynecology

## 2016-11-27 DIAGNOSIS — R928 Other abnormal and inconclusive findings on diagnostic imaging of breast: Secondary | ICD-10-CM

## 2016-11-27 DIAGNOSIS — N6001 Solitary cyst of right breast: Secondary | ICD-10-CM | POA: Diagnosis not present

## 2016-11-27 DIAGNOSIS — R922 Inconclusive mammogram: Secondary | ICD-10-CM | POA: Diagnosis not present

## 2016-12-19 DIAGNOSIS — Z23 Encounter for immunization: Secondary | ICD-10-CM | POA: Diagnosis not present

## 2017-01-17 DIAGNOSIS — S0502XA Injury of conjunctiva and corneal abrasion without foreign body, left eye, initial encounter: Secondary | ICD-10-CM | POA: Diagnosis not present

## 2017-02-20 DIAGNOSIS — H04123 Dry eye syndrome of bilateral lacrimal glands: Secondary | ICD-10-CM | POA: Diagnosis not present

## 2017-02-21 ENCOUNTER — Ambulatory Visit (INDEPENDENT_AMBULATORY_CARE_PROVIDER_SITE_OTHER): Payer: BLUE CROSS/BLUE SHIELD | Admitting: Family Medicine

## 2017-02-21 ENCOUNTER — Encounter: Payer: Self-pay | Admitting: Family Medicine

## 2017-02-21 VITALS — BP 120/78 | HR 79 | Temp 98.7°F | Wt 160.4 lb

## 2017-02-21 DIAGNOSIS — Z209 Contact with and (suspected) exposure to unspecified communicable disease: Secondary | ICD-10-CM | POA: Diagnosis not present

## 2017-02-21 DIAGNOSIS — H40053 Ocular hypertension, bilateral: Secondary | ICD-10-CM | POA: Diagnosis not present

## 2017-02-21 DIAGNOSIS — F5109 Other insomnia not due to a substance or known physiological condition: Secondary | ICD-10-CM | POA: Diagnosis not present

## 2017-02-21 MED ORDER — CIPROFLOXACIN HCL 500 MG PO TABS: 500.0000 mg | ORAL_TABLET | Freq: Two times a day (BID) | ORAL | 0 refills | Status: DC

## 2017-02-21 MED ORDER — ZOLPIDEM TARTRATE 10 MG PO TABS: 10.0000 mg | ORAL_TABLET | Freq: Every evening | ORAL | 1 refills | Status: DC | PRN
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
## 2017-02-22 DIAGNOSIS — H40053 Ocular hypertension, bilateral: Secondary | ICD-10-CM | POA: Diagnosis not present

## 2017-02-26 DIAGNOSIS — H40053 Ocular hypertension, bilateral: Secondary | ICD-10-CM | POA: Diagnosis not present

## 2017-02-28 DIAGNOSIS — H40053 Ocular hypertension, bilateral: Secondary | ICD-10-CM | POA: Diagnosis not present

## 2017-03-11 ENCOUNTER — Telehealth: Payer: Self-pay | Admitting: Family Medicine

## 2017-03-11 NOTE — Telephone Encounter (Signed)
Copied from [place] [id number]. Topic: Quick Communication - See Telephone Encounter >> Mar 11, 2017  9:35 AM Ahmed Prima L wrote: CRM for notification. See Telephone encounter for:   03/11/17.  Patient called and stated that she did not like the zolpidem (AMBIEN) 10 MG tablet. She wants to know if there a substitute for this?  Call back is [phone] [id number] [place], [place] - [address]

## 2017-03-11 NOTE — Telephone Encounter (Signed)
Called pt and left a VM to call back. Why did she never try the Ambien is there a reason why she didn't want to try Ambien is there something in particular that she wants to try?

## 2017-03-14 NOTE — Telephone Encounter (Signed)
Patient calling back and states she did try it before, it does not work. Please advise.

## 2017-03-15 MED ORDER — TEMAZEPAM 30 MG PO CAPS: 30.0000 mg | ORAL_CAPSULE | Freq: Every evening | ORAL | 2 refills | Status: DC | PRN

## 2017-03-15 NOTE — Telephone Encounter (Signed)
Call in Temazepam 30 mg qhs, #30 with 2 rf 

## 2017-03-15 NOTE — Telephone Encounter (Signed)
Sent to PCP for approval.  

## 2017-03-15 NOTE — Telephone Encounter (Signed)
Called pt and left a VM that we sent in a new medication for her to try for sleep. Called in Rx into pharmacy and asked them to remove her Ambien Rx.

## 2017-03-19 MED ORDER — TEMAZEPAM 15 MG PO CAPS: 30.0000 mg | ORAL_CAPSULE | Freq: Every evening | ORAL | 5 refills | Status: DC | PRN

## 2017-03-19 NOTE — Telephone Encounter (Signed)
Call in Temazepam 15 mg to take 2 qhs, #60 with 5 rf

## 2017-03-19 NOTE — Telephone Encounter (Signed)
Patient requesting a call and let her know [phone]

## 2017-03-19 NOTE — Telephone Encounter (Signed)
Called [facility] they do have the 15 Mg dose this is not on back order just FYI sent to PCP

## 2017-03-19 NOTE — Telephone Encounter (Signed)
Temazepam is on back order and will not be able to get it before she leave, can something else be called in?

## 2017-03-19 NOTE — Telephone Encounter (Signed)
Sent to PCP for another medication option for pt since temazepam is on back order.

## 2017-03-19 NOTE — Telephone Encounter (Signed)
Removed 30 MG dose of the temazepam due to backorder at pharmacy called in NEW Rx for the 15 MG of temazepam.

## 2017-03-19 NOTE — Addendum Note (Signed)
Addended by: Myriam Forehand on: 03/19/2017 01:57 PM   Modules accepted: Orders

## 2017-03-26 DIAGNOSIS — J019 Acute sinusitis, unspecified: Secondary | ICD-10-CM | POA: Diagnosis not present

## 2017-03-26 DIAGNOSIS — H6502 Acute serous otitis media, left ear: Secondary | ICD-10-CM | POA: Diagnosis not present

## 2017-04-03 ENCOUNTER — Ambulatory Visit: Payer: BLUE CROSS/BLUE SHIELD | Admitting: Family Medicine

## 2017-04-03 DIAGNOSIS — Z0289 Encounter for other administrative examinations: Secondary | ICD-10-CM

## 2017-04-13 ENCOUNTER — Other Ambulatory Visit: Payer: Self-pay | Admitting: Family Medicine

## 2017-08-13 DIAGNOSIS — D2262 Melanocytic nevi of left upper limb, including shoulder: Secondary | ICD-10-CM | POA: Diagnosis not present

## 2017-08-13 DIAGNOSIS — L720 Epidermal cyst: Secondary | ICD-10-CM | POA: Diagnosis not present

## 2017-08-13 DIAGNOSIS — L718 Other rosacea: Secondary | ICD-10-CM | POA: Diagnosis not present

## 2017-09-17 ENCOUNTER — Other Ambulatory Visit: Payer: Self-pay | Admitting: Family Medicine

## 2017-10-17 DIAGNOSIS — H6592 Unspecified nonsuppurative otitis media, left ear: Secondary | ICD-10-CM | POA: Diagnosis not present

## 2017-11-08 ENCOUNTER — Other Ambulatory Visit: Payer: Self-pay | Admitting: Family Medicine

## 2017-11-20 DIAGNOSIS — Z6827 Body mass index (BMI) 27.0-27.9, adult: Secondary | ICD-10-CM | POA: Diagnosis not present

## 2017-11-20 DIAGNOSIS — Z1231 Encounter for screening mammogram for malignant neoplasm of breast: Secondary | ICD-10-CM | POA: Diagnosis not present

## 2017-11-20 DIAGNOSIS — Z01419 Encounter for gynecological examination (general) (routine) without abnormal findings: Secondary | ICD-10-CM | POA: Diagnosis not present

## 2017-11-27 DIAGNOSIS — Z23 Encounter for immunization: Secondary | ICD-10-CM | POA: Diagnosis not present

## 2017-12-19 ENCOUNTER — Ambulatory Visit (INDEPENDENT_AMBULATORY_CARE_PROVIDER_SITE_OTHER): Payer: BLUE CROSS/BLUE SHIELD | Admitting: Family Medicine

## 2017-12-19 ENCOUNTER — Encounter: Payer: Self-pay | Admitting: Family Medicine

## 2017-12-19 VITALS — BP 130/80 | HR 79 | Temp 98.8°F | Ht 60.5 in | Wt 155.0 lb

## 2017-12-19 DIAGNOSIS — Z0000 Encounter for general adult medical examination without abnormal findings: Secondary | ICD-10-CM | POA: Diagnosis not present

## 2017-12-19 LAB — BASIC METABOLIC PANEL
BUN: 18 mg/dL (ref 6–23)
CALCIUM: 9.6 mg/dL (ref 8.4–10.5)
CO2: 29 mEq/L (ref 19–32)
CREATININE: 0.76 mg/dL (ref 0.40–1.20)
Chloride: 103 mEq/L (ref 96–112)
GFR: 82.89 mL/min (ref >60.00)
Glucose, Bld: 92 mg/dL (ref 70–99)
Potassium: 4.7 mEq/L (ref 3.5–5.1)
SODIUM: 139 meq/L (ref 135–145)

## 2017-12-19 LAB — LIPID PANEL
CHOL/HDL RATIO: 2
Cholesterol: 165 mg/dL (ref 0–200)
HDL: 70.4 mg/dL (ref >39.00)
LDL Cholesterol: 79 mg/dL (ref 0–99)
NONHDL: 94.7
Triglycerides: 77 mg/dL (ref 0.0–149.0)
VLDL: 15.4 mg/dL (ref 0.0–40.0)

## 2017-12-19 LAB — POC URINALSYSI DIPSTICK (AUTOMATED)
Bilirubin, UA: NEGATIVE
Blood, UA: NEGATIVE
GLUCOSE UA: NEGATIVE
Ketones, UA: NEGATIVE
LEUKOCYTES UA: NEGATIVE
NITRITE UA: NEGATIVE
PROTEIN UA: NEGATIVE
SPEC GRAV UA: 1.01 (ref 1.010–1.025)
UROBILINOGEN UA: 0.2 U/dL
pH, UA: 6.5 (ref 5.0–8.0)

## 2017-12-19 LAB — TSH: TSH: 1.5 u[IU]/mL (ref 0.35–4.50)

## 2017-12-19 LAB — HEPATIC FUNCTION PANEL
ALK PHOS: 72 U/L (ref 39–117)
ALT: 21 U/L (ref 0–35)
AST: 19 U/L (ref 0–37)
Albumin: 5.1 g/dL (ref 3.5–5.2)
BILIRUBIN TOTAL: 0.7 mg/dL (ref 0.2–1.2)
Bilirubin, Direct: 0.2 mg/dL (ref 0.0–0.3)
Total Protein: 7.4 g/dL (ref 6.0–8.3)

## 2017-12-19 LAB — CBC WITH DIFFERENTIAL/PLATELET
BASOS PCT: 0.7 % (ref 0.0–3.0)
Basophils Absolute: 0 10*3/uL (ref 0.0–0.1)
EOS PCT: 1.4 % (ref 0.0–5.0)
Eosinophils Absolute: 0.1 10*3/uL (ref 0.0–0.7)
HEMATOCRIT: 41.1 % (ref 36.0–46.0)
HEMOGLOBIN: 14.1 g/dL (ref 12.0–15.0)
LYMPHS PCT: 23 % (ref 12.0–46.0)
Lymphs Abs: 1.2 10*3/uL (ref 0.7–4.0)
MCHC: 34.2 g/dL (ref 30.0–36.0)
MCV: 87.1 fl (ref 78.0–100.0)
MONO ABS: 0.4 10*3/uL (ref 0.1–1.0)
MONOS PCT: 7.1 % (ref 3.0–12.0)
Neutro Abs: 3.6 10*3/uL (ref 1.4–7.7)
Neutrophils Relative %: 67.8 % (ref 43.0–77.0)
Platelets: 306 10*3/uL (ref 150.0–400.0)
RBC: 4.72 Mil/uL (ref 3.87–5.11)
RDW: 13.1 % (ref 11.5–15.5)
WBC: 5.3 10*3/uL (ref 4.0–10.5)

## 2017-12-19 NOTE — Progress Notes (Signed)
   Subjective:    Patient ID: Mercedes [name], female    DOB: Oct 26, 1960, 57 y.o.   MRN: [id number]  HPI Here for a well exam. She feels fine. She is in contact with the GI office to set up another colonoscopy.    Review of Systems  Constitutional: Negative.   HENT: Negative.   Eyes: Negative.   Respiratory: Negative.   Cardiovascular: Negative.   Gastrointestinal: Negative.   Genitourinary: Negative for decreased urine volume, difficulty urinating, dyspareunia, dysuria, enuresis, flank pain, frequency, hematuria, pelvic pain and urgency.  Musculoskeletal: Negative.   Skin: Negative.   Neurological: Negative.   Psychiatric/Behavioral: Negative.        Objective:   Physical Exam  Constitutional: She is oriented to person, place, and time. She appears well-developed and well-nourished. No distress.  HENT:  Head: Normocephalic and atraumatic.  Right Ear: External ear normal.  Left Ear: External ear normal.  Nose: Nose normal.  Mouth/Throat: Oropharynx is clear and moist. No oropharyngeal exudate.  Eyes: Pupils are equal, round, and reactive to light. Conjunctivae and EOM are normal. No scleral icterus.  Neck: Normal range of motion. Neck supple. No JVD present. No thyromegaly present.  Cardiovascular: Normal rate, regular rhythm, normal heart sounds and intact distal pulses. Exam reveals no gallop and no friction rub.  No murmur heard. Pulmonary/Chest: Effort normal and breath sounds normal. No respiratory distress. She has no wheezes. She has no rales. She exhibits no tenderness.  Abdominal: Soft. Bowel sounds are normal. She exhibits no distension and no mass. There is no tenderness. There is no rebound and no guarding.  Musculoskeletal: Normal range of motion. She exhibits no edema or tenderness.  Lymphadenopathy:    She has no cervical adenopathy.  Neurological: She is alert and oriented to person, place, and time. She has normal reflexes. She displays normal reflexes. No  cranial nerve deficit. She exhibits normal muscle tone. Coordination normal.  Skin: Skin is warm and dry. No rash noted. No erythema.  Psychiatric: She has a normal mood and affect. Her behavior is normal. Judgment and thought content normal.          Assessment & Plan:  Well exam. We discussed diet and exercise. Get fasting labs.  Alysia Penna, MD

## 2017-12-25 ENCOUNTER — Encounter: Payer: Self-pay | Admitting: *Deleted

## 2018-01-06 ENCOUNTER — Encounter: Payer: Self-pay | Admitting: Internal Medicine

## 2018-02-14 ENCOUNTER — Ambulatory Visit (AMBULATORY_SURGERY_CENTER): Payer: Self-pay

## 2018-02-14 ENCOUNTER — Other Ambulatory Visit: Payer: Self-pay

## 2018-02-14 ENCOUNTER — Encounter: Payer: Self-pay | Admitting: Internal Medicine

## 2018-02-14 VITALS — Ht 62.0 in | Wt 152.8 lb

## 2018-02-14 DIAGNOSIS — Z86010 Personal history of colonic polyps: Secondary | ICD-10-CM

## 2018-02-14 NOTE — Progress Notes (Signed)
No egg or soy allergy known to patient  No issues with past sedation with any surgeries  or procedures, no intubation problems  No diet pills per patient No home 02 use per patient  No blood thinners per patient  Pt denies issues with constipation  No A fib or A flutter  EMMI video sent to pt's e mail , pt declined    

## 2018-02-18 ENCOUNTER — Telehealth: Payer: Self-pay | Admitting: Internal Medicine

## 2018-02-18 NOTE — Telephone Encounter (Signed)
Spoke with patient. Answered her prep questions. She is able to take her meds as directed.

## 2018-02-18 NOTE — Telephone Encounter (Signed)
Patient wants to know is she can take her prescribed meds the day before her procedure.

## 2018-02-25 ENCOUNTER — Ambulatory Visit (AMBULATORY_SURGERY_CENTER): Payer: BLUE CROSS/BLUE SHIELD | Admitting: Internal Medicine

## 2018-02-25 ENCOUNTER — Encounter: Payer: Self-pay | Admitting: Internal Medicine

## 2018-02-25 VITALS — BP 93/63 | HR 65 | Temp 99.6°F | Resp 15 | Ht 62.0 in | Wt 152.0 lb

## 2018-02-25 DIAGNOSIS — Z86010 Personal history of colonic polyps: Secondary | ICD-10-CM | POA: Diagnosis not present

## 2018-02-25 HISTORY — PX: COLONOSCOPY: SHX174

## 2018-02-25 MED ORDER — SODIUM CHLORIDE 0.9 % IV SOLN: 500.0000 mL | Freq: Once | INTRAVENOUS | Status: DC

## 2018-02-25 NOTE — Op Note (Signed)
[place] [place] Patient Name: Mercedes [name] Procedure Date: 02/25/2018 8:02 AM MRN: [id number] Endoscopist: Gatha Mayer , MD Age: 58 Referring MD:  Date of Birth: May 26, 1960 Gender: Female Account #: [id number] Procedure:                Colonoscopy Indications:              High risk colon cancer surveillance: Personal                            history of colonic polyps Medicines:                Propofol per Anesthesia, Monitored Anesthesia Care Procedure:                Pre-Anesthesia Assessment:                           - Prior to the procedure, a History and Physical                            was performed, and patient medications and                            allergies were reviewed. The patient's tolerance of                            previous anesthesia was also reviewed. The risks                            and benefits of the procedure and the sedation                            options and risks were discussed with the patient.                            All questions were answered, and informed consent                            was obtained. Prior Anticoagulants: The patient has                            taken no previous anticoagulant or antiplatelet                            agents. ASA Grade Assessment: II - A patient with                            mild systemic disease. After reviewing the risks                            and benefits, the patient was deemed in                            satisfactory condition to undergo the procedure.  After obtaining informed consent, the colonoscope                            was passed under direct vision. Throughout the                            procedure, the patient's blood pressure, pulse, and                            oxygen saturations were monitored continuously. The                            Model CF-HQ190L [id number]) scope was introduced                            through the  anus and advanced to the the cecum,                            identified by appendiceal orifice and ileocecal                            valve. The quality of the bowel preparation was                            excellent. The colonoscopy was performed without                            difficulty. The patient tolerated the procedure                            well. The bowel preparation used was Miralax. Scope In: 8:08:04 AM Scope Out: 8:25:23 AM Scope Withdrawal Time: 0 hours 12 minutes 55 seconds  Total Procedure Duration: 0 hours 17 minutes 19 seconds  Findings:                 The perianal and digital rectal examinations were                            normal.                           The colon (entire examined portion) appeared normal.                           No additional abnormalities were found on                            retroflexion. Complications:            No immediate complications. Estimated blood loss:                            None. Estimated Blood Loss:     Estimated blood loss: none. Impression:               - The entire examined colon is normal.                           -  No specimens collected.                           - Personal history of colonic polyp 6 mm adenoma                            2013. Recommendation:           - Repeat colonoscopy in 10 years.                           - Patient has a contact number available for                            emergencies. The signs and symptoms of potential                            delayed complications were discussed with the                            patient. Return to normal activities tomorrow.                            Written discharge instructions were provided to the                            patient.                           - Resume previous diet.                           - Continue present medications. Gatha Mayer, MD 02/25/2018 8:36:24 AM This report has been signed electronically.

## 2018-02-25 NOTE — Progress Notes (Signed)
Pt's states no medical or surgical changes since previsit or office visit. 

## 2018-02-25 NOTE — Progress Notes (Signed)
Spontaneous respirations throughout. VSS. Resting comfortably. To PACU on room air. Report to  RN. 

## 2018-02-25 NOTE — Patient Instructions (Addendum)
   No polyps today so repeat colonoscopy ion 2030 (10 years).  I appreciate the opportunity to care for you. Gatha Mayer, MD, FACG YOU HAD AN ENDOSCOPIC PROCEDURE TODAY AT [facility]:   Refer to the procedure report that was given to you for any specific questions about what was found during the examination.  If the procedure report does not answer your questions, please call your gastroenterologist to clarify.  If you requested that your care partner not be given the details of your procedure findings, then the procedure report has been included in a sealed envelope for you to review at your convenience later.  YOU SHOULD EXPECT: Some feelings of bloating in the abdomen. Passage of more gas than usual.  Walking can help get rid of the air that was put into your GI tract during the procedure and reduce the bloating. If you had a lower endoscopy (such as a colonoscopy or flexible sigmoidoscopy) you may notice spotting of blood in your stool or on the toilet paper. If you underwent a bowel prep for your procedure, you may not have a normal bowel movement for a few days.  Please Note:  You might notice some irritation and congestion in your nose or some drainage.  This is from the oxygen used during your procedure.  There is no need for concern and it should clear up in a day or so.  SYMPTOMS TO REPORT IMMEDIATELY:   Following lower endoscopy (colonoscopy or flexible sigmoidoscopy):  Excessive amounts of blood in the stool  Significant tenderness or worsening of abdominal pains  Swelling of the abdomen that is new, acute  Fever of 100F or higher   For urgent or emergent issues, a gastroenterologist can be reached at any hour by calling [phone].   DIET:  We do recommend a small meal at first, but then you may proceed to your regular diet.  Drink plenty of fluids but you should avoid alcoholic beverages for 24 hours.  ACTIVITY:  You should plan to take it easy  for the rest of today and you should NOT DRIVE or use heavy machinery until tomorrow (because of the sedation medicines used during the test).    FOLLOW UP: Our staff will call the number listed on your records the next business day following your procedure to check on you and address any questions or concerns that you may have regarding the information given to you following your procedure. If we do not reach you, we will leave a message.  However, if you are feeling well and you are not experiencing any problems, there is no need to return our call.  We will assume that you have returned to your regular daily activities without incident.  If any biopsies were taken you will be contacted by phone or by letter within the next 1-3 weeks.  Please call us at [phone] if you have not heard about the biopsies in 3 weeks.    SIGNATURES/CONFIDENTIALITY: You and/or your care partner have signed paperwork which will be entered into your electronic medical record.  These signatures attest to the fact that that the information above on your After Visit Summary has been reviewed and is understood.  Full responsibility of the confidentiality of this discharge information lies with you and/or your care-partner.

## 2018-02-26 ENCOUNTER — Telehealth: Payer: Self-pay

## 2018-02-26 NOTE — Telephone Encounter (Signed)
Left message to call if concerns or questions.

## 2018-02-26 NOTE — Telephone Encounter (Signed)
No answer, left message to call back later today, B.Denney Shein RN. 

## 2018-03-04 DIAGNOSIS — H40023 Open angle with borderline findings, high risk, bilateral: Secondary | ICD-10-CM | POA: Diagnosis not present

## 2018-03-04 DIAGNOSIS — H40053 Ocular hypertension, bilateral: Secondary | ICD-10-CM | POA: Diagnosis not present

## 2018-03-12 DIAGNOSIS — H40053 Ocular hypertension, bilateral: Secondary | ICD-10-CM | POA: Diagnosis not present

## 2018-03-19 DIAGNOSIS — Z23 Encounter for immunization: Secondary | ICD-10-CM | POA: Diagnosis not present

## 2018-03-19 DIAGNOSIS — H40053 Ocular hypertension, bilateral: Secondary | ICD-10-CM | POA: Diagnosis not present

## 2018-04-02 DIAGNOSIS — S61451A Open bite of right hand, initial encounter: Secondary | ICD-10-CM | POA: Diagnosis not present

## 2018-04-02 DIAGNOSIS — W540XXA Bitten by dog, initial encounter: Secondary | ICD-10-CM | POA: Diagnosis not present

## 2018-04-03 ENCOUNTER — Ambulatory Visit: Payer: Self-pay

## 2018-04-03 ENCOUNTER — Telehealth: Payer: Self-pay | Admitting: Family Medicine

## 2018-04-03 NOTE — Telephone Encounter (Signed)
Pt. Called to report she was bit by a dog yesterday at approx. 4:00 PM.  Reported the dog is a Media planner, and one year old.  This dog is her grand-dog, and is up to date on it's shots.  Pt. reported she went to [facility] about 1.5 hrs. after the bite.  Stated there are 2 puncture wounds, one small tear on top of right hand and one small tear near thumbnail.  Per the provider at [facility], the skin tears did not require stitches.  Reported area was cleaned/ bandaged.  Last Tdap was 08/05/15.  Advised with Tetanus being within last 5 yrs., she likely will not require a booster at this time. Was started on Augmentin.  Advised will notify Mercedes [name] of injury, and request further recommendations.  Home Care advice given per protocol.  Reviewed signs of infection to monitor for, and call office if worsening of symptoms. Pt. Verb. Understanding.        Reason for Disposition . [1] Taking antibiotic AND [2] infected bite wound looks improved AND [3] no fever    rec'd bite on right hand from 1 yr. old Armandina Gemma Doodle Puppy 04/02/18; pt's grand-dog.  Reported she went to [facility] within short time after the bite.  Area was cleaned/ bandaged.  Was placed on Augmentin.  Answer Assessment - Initial Assessment Questions 1. ANIMAL: "What type of animal caused the bite?" "Is the injury from a bite or a claw?" If the animal is a dog or a cat, ask: "Was it a pet or a stray?" "Was it acting ill or behaving strangely?"     [employer] puppy; grand-dog 2. LOCATION: "Where is the bite located?"      Top of right hand 3. SIZE: "How big is the bite?" "What does it look like?"      3 bite marks; 2 puncture wounds, and one was a minor tear; small tear near thumbnail 4. ONSET: "When did the bite happen?" (Minutes or hours ago)      4:00 PM; went to [facility] at 5:40 PM 5. CIRCUMSTANCES: "Tell me how this happened."      Got near the dog's food dish   6. TETANUS: "When was the last tetanus booster?"  08/05/15 7. PREGNANCY: "Is there any chance you are pregnant?" "When was your last menstrual period?"     n/a  Protocols used: ANIMAL BITE-A-AH

## 2018-04-03 NOTE — Telephone Encounter (Signed)
Patient informed of Dr. Barbie Banner message. He would have recommended same treatment at [facility]. Stated she understood.

## 2018-04-03 NOTE — Telephone Encounter (Signed)
Message from Carolyn Stare sent at 04/03/2018 8:19 AM EST    Pt was bitten by a dog [facility] was given a antibiotic and is asking if she need a tetanus shot. Can leave a message saying yes or no if she doesn't answer

## 2018-04-03 NOTE — Telephone Encounter (Signed)
This is exactly what I probably would have done. Take the Augmentin. Recheck as needed

## 2018-04-03 NOTE — Telephone Encounter (Signed)
LM for call back

## 2018-04-04 NOTE — Telephone Encounter (Signed)
Called the pt and she is aware of Dr. Keane Police,  She stated that she is doing well and taking the abx.

## 2018-04-24 DIAGNOSIS — H66001 Acute suppurative otitis media without spontaneous rupture of ear drum, right ear: Secondary | ICD-10-CM | POA: Diagnosis not present

## 2018-04-24 DIAGNOSIS — H5213 Myopia, bilateral: Secondary | ICD-10-CM | POA: Diagnosis not present

## 2018-04-24 DIAGNOSIS — H401131 Primary open-angle glaucoma, bilateral, mild stage: Secondary | ICD-10-CM | POA: Diagnosis not present

## 2018-04-24 DIAGNOSIS — H6690 Otitis media, unspecified, unspecified ear: Secondary | ICD-10-CM | POA: Diagnosis not present

## 2018-06-05 DIAGNOSIS — H401131 Primary open-angle glaucoma, bilateral, mild stage: Secondary | ICD-10-CM | POA: Diagnosis not present

## 2018-06-13 ENCOUNTER — Telehealth: Payer: Self-pay | Admitting: Family Medicine

## 2018-06-13 NOTE — Telephone Encounter (Signed)
Phone call rec'd from pt. On COVID line.  Wanted to discuss son's work situation and possible need for being tested for the COVID virus.    Phone call returned to patient.  Reported her son is a Librarian, academic on a Architect site in [place], with a large crew, and one of his crew members tested positive for coronavirus. Reported that her son did not have close contact with the crew member that tested positive.  Also reported the son has a 30 week old baby at home.  Voice concern about need to have him tested.  Advised if no symptoms and no close contact with the individual, he would not meet criteria to be tested.  Encouraged to have son follow safe practices, to reduce risk of contracting COVID 19.  Advised of the incubation period.  Pt. Verb. Understanding.  Stated she has read the COVID information on the [employer] site.  Questions answered.

## 2018-06-29 IMAGING — MG 2D DIGITAL DIAGNOSTIC UNILATERAL RIGHT MAMMOGRAM WITH CAD AND AD
6 series · 6 of 14 positions shown · non-contrast
Comparison: Previous exam(s).

CLINICAL DATA: 56-year-old female recalled from screening mammogram
dated 11/13/2016 for a possible right breast mass.

EXAM:
2D DIGITAL DIAGNOSTIC RIGHT MAMMOGRAM WITH CAD AND ADJUNCT TOMO
ULTRASOUND RIGHT BREAST

[R CC synth-2D]
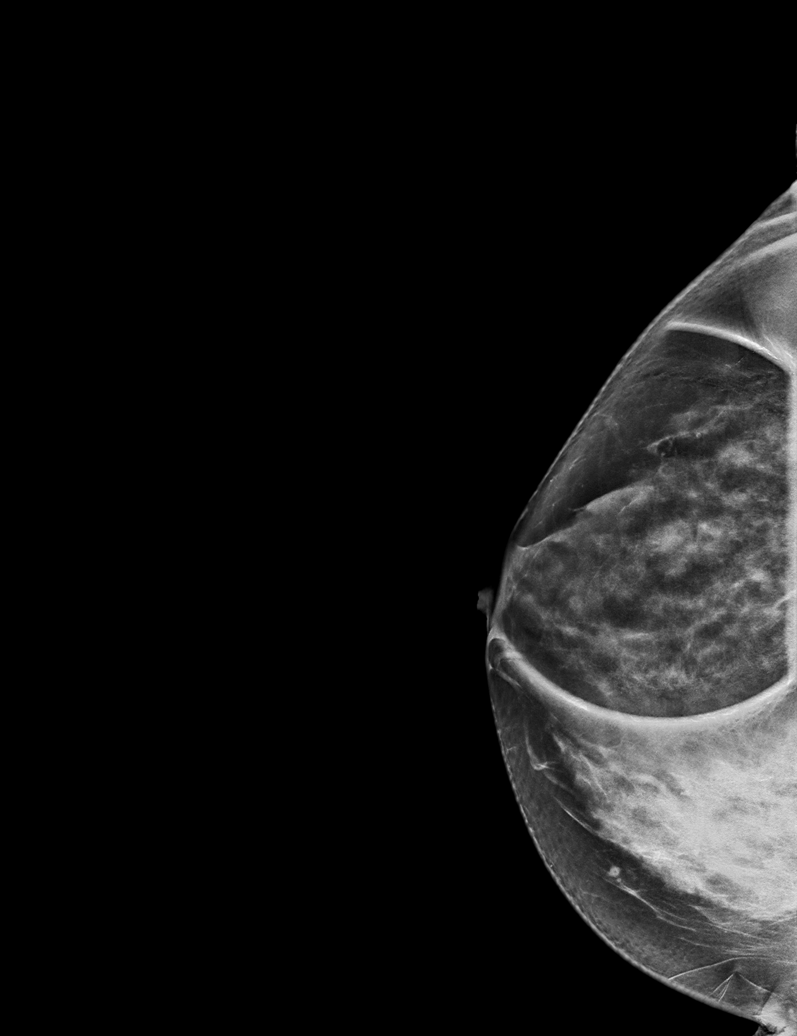

[R MLO]
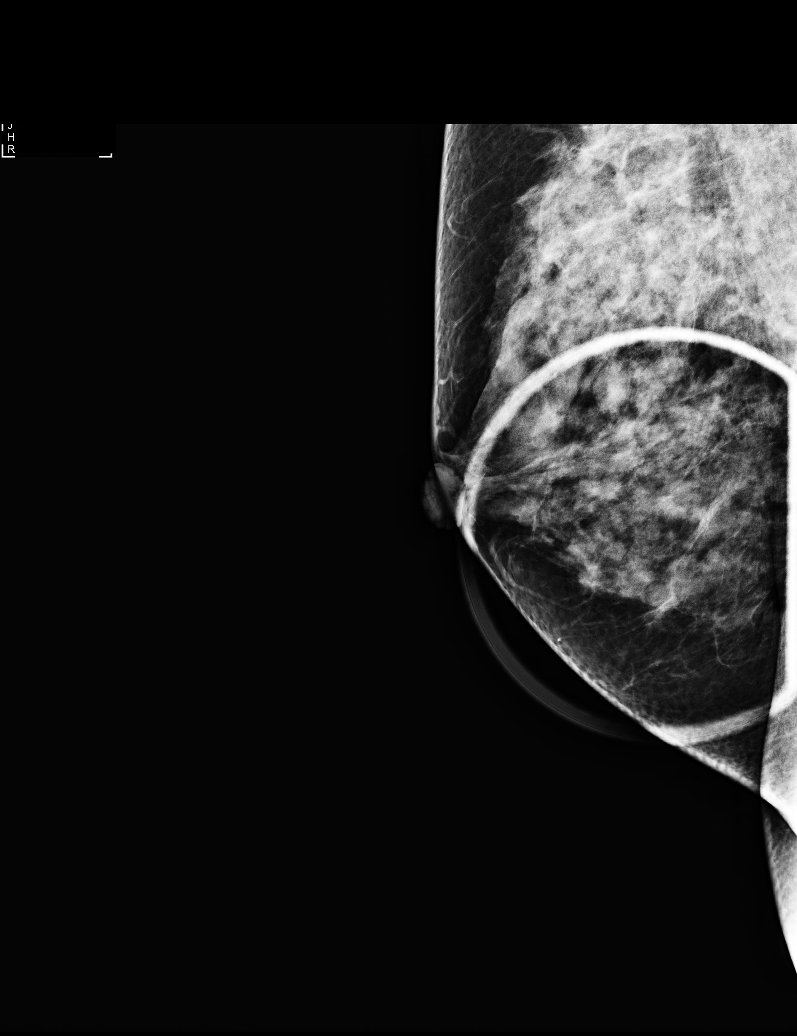

[R MLO synth-2D]
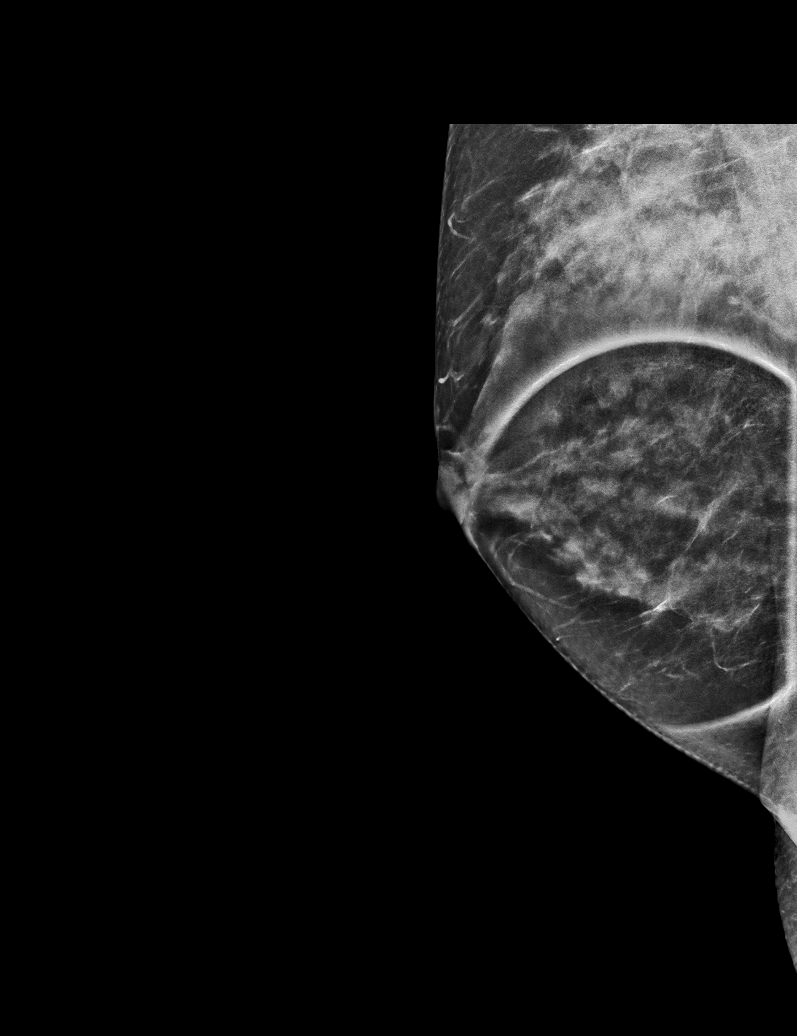

[R CC]
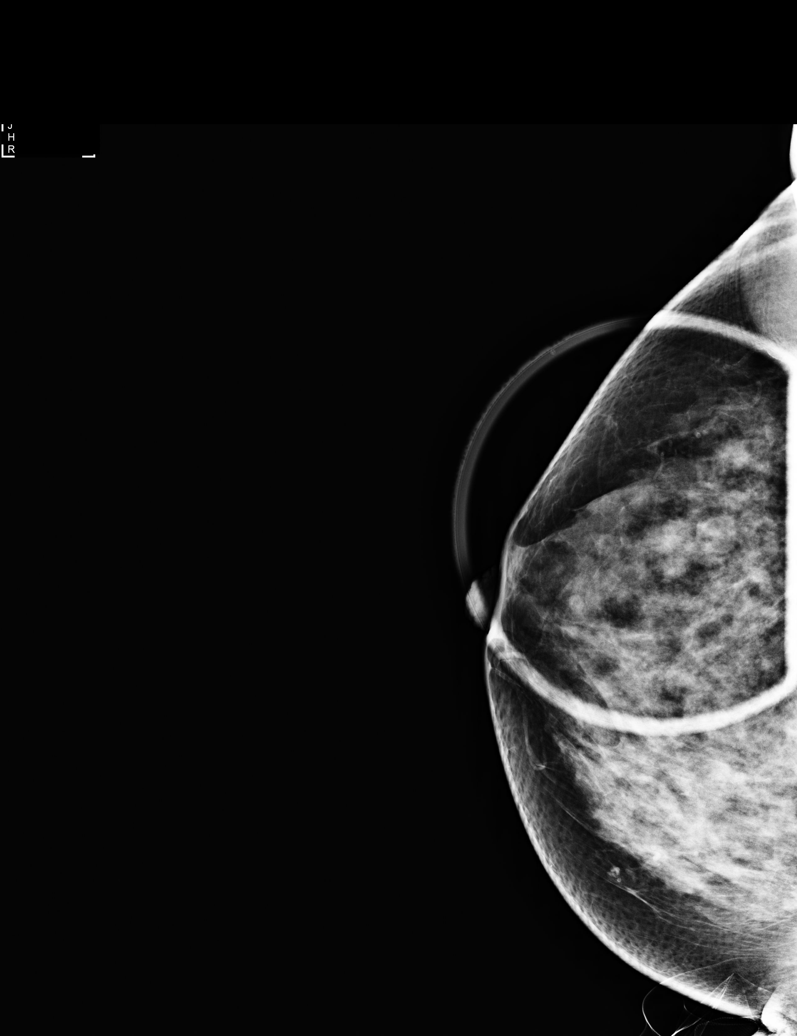

[R MLO tomo · tomo slice 32/63.0]
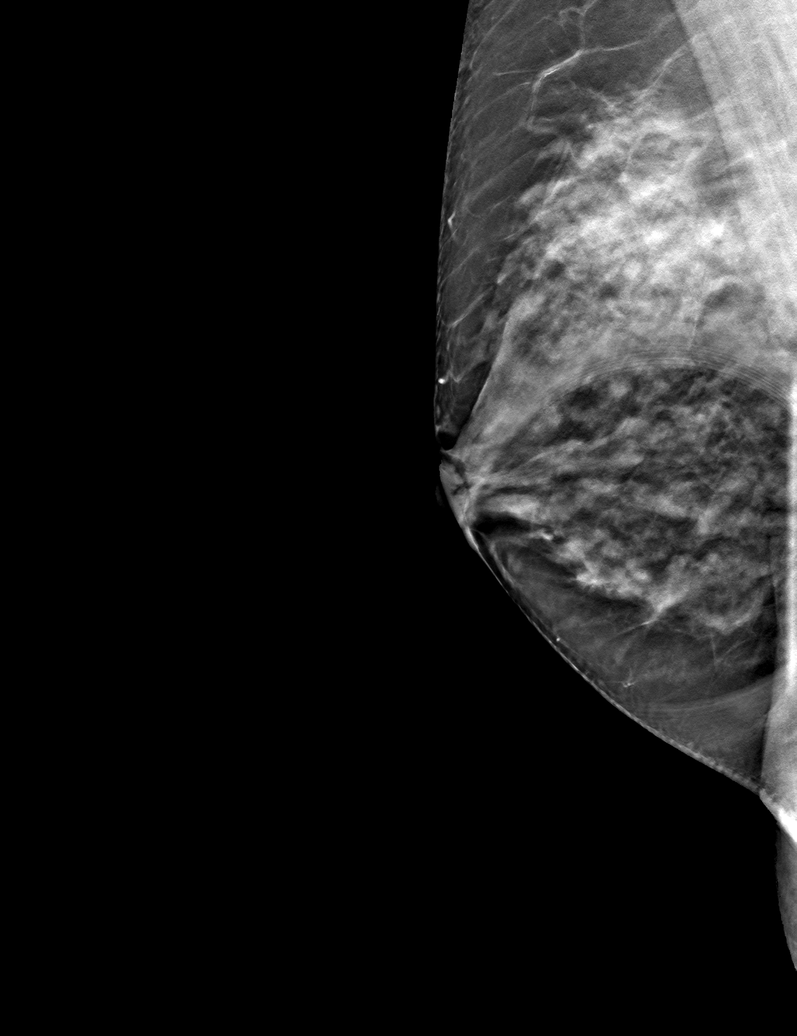

[R CC tomo · tomo slice 35/70.0]
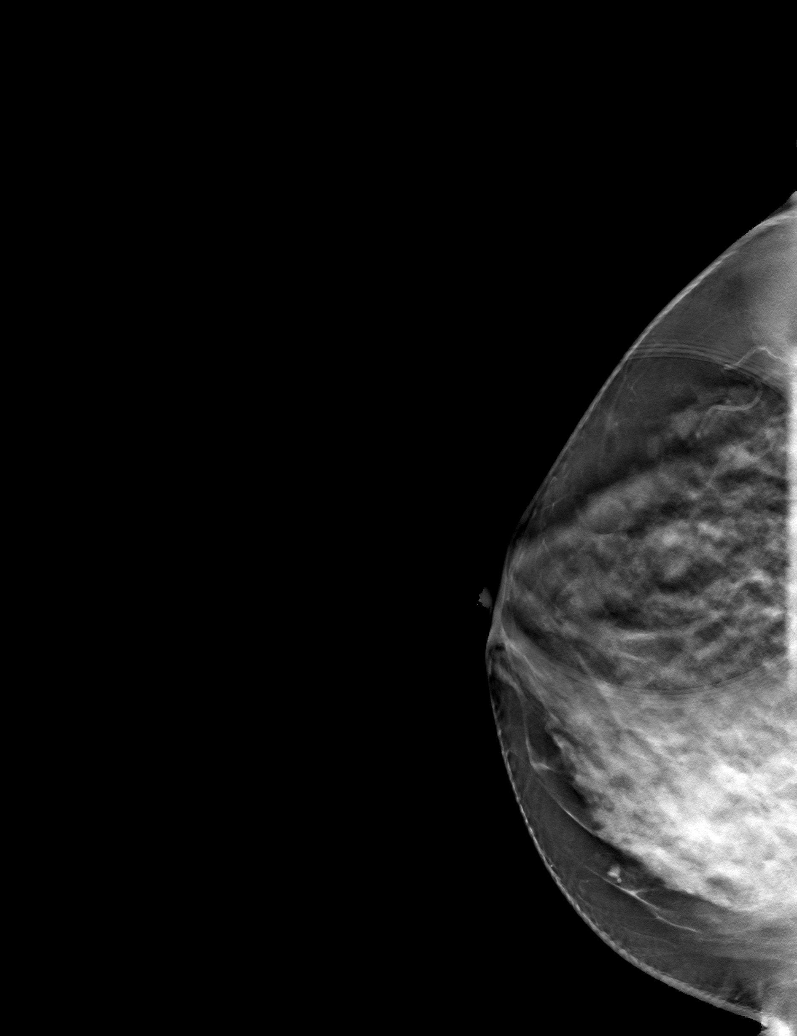

[6 of 14 positions shown; findings below may reference images not displayed]

ACR Breast Density Category c: The breast tissue is heterogeneously
dense, which may obscure small masses.
FINDINGS: Previously described possible mass in the lateral right breast
persists on today's additional views. It appears round,
circumscribed and equal density. Further evaluation with ultrasound
was performed.

Mammographic images were processed with CAD.

Targeted ultrasound is performed, showing a simple cyst at the 8
o'clock position 2 cm from the nipple. It measures 0.9 x 0.8 x
cm. There is no internal vascularity. This correlates with the
mammographic finding.
IMPRESSION: Benign right breast simple cyst. No further imaging follow-up
required.

RECOMMENDATION:
Screening mammogram in one year.(Code:M2-0-QXC)

I have discussed the findings and recommendations with the patient.
Results were also provided in writing at the conclusion of the
visit. If applicable, a reminder letter will be sent to the patient
regarding the next appointment.

BI-RADS CATEGORY  2: Benign.

## 2018-07-08 DIAGNOSIS — L821 Other seborrheic keratosis: Secondary | ICD-10-CM | POA: Diagnosis not present

## 2018-07-08 DIAGNOSIS — D2262 Melanocytic nevi of left upper limb, including shoulder: Secondary | ICD-10-CM | POA: Diagnosis not present

## 2018-07-08 DIAGNOSIS — L814 Other melanin hyperpigmentation: Secondary | ICD-10-CM | POA: Diagnosis not present

## 2018-09-24 DIAGNOSIS — Z23 Encounter for immunization: Secondary | ICD-10-CM | POA: Diagnosis not present

## 2018-09-25 DIAGNOSIS — H401131 Primary open-angle glaucoma, bilateral, mild stage: Secondary | ICD-10-CM | POA: Diagnosis not present

## 2018-10-07 ENCOUNTER — Encounter: Payer: Self-pay | Admitting: Family Medicine

## 2018-10-30 DIAGNOSIS — Z23 Encounter for immunization: Secondary | ICD-10-CM | POA: Diagnosis not present

## 2018-12-04 ENCOUNTER — Telehealth: Payer: Self-pay | Admitting: *Deleted

## 2018-12-04 NOTE — Telephone Encounter (Signed)
Copied from [place] [id number]. Topic: General - Other >> Dec 04, 2018  1:50 PM Rainey Pines A wrote: Patient stated that her grandson was recently diagnosed with condition and wants to know if she and her husband  are recessive gene carriers for this . Patient wants to know can she test at office for this and if not where she can go. Please advise.

## 2018-12-05 NOTE — Telephone Encounter (Signed)
Not sure of condition pt is referring to. Will call pt Monday to obtain further information.

## 2018-12-09 NOTE — Telephone Encounter (Signed)
Left message to return phone call.

## 2018-12-09 NOTE — Telephone Encounter (Signed)
She needs to find out from her grandson's Neurologist what these genes are and what tests they may need. I am not familiar with this condition

## 2018-12-09 NOTE — Telephone Encounter (Signed)
Spoke to pt and she stated that about 3wks ago her grandson was dx with SMA spinal muscular atrophy level 75 at 59 year old. Pt wants to know if she has the genes. Routing to Dr.Fry for advise.

## 2018-12-11 NOTE — Telephone Encounter (Signed)
Patient notified of update  and verbalized understanding. 

## 2018-12-18 DIAGNOSIS — Z01419 Encounter for gynecological examination (general) (routine) without abnormal findings: Secondary | ICD-10-CM | POA: Diagnosis not present

## 2018-12-18 DIAGNOSIS — Z8481 Family history of carrier of genetic disease: Secondary | ICD-10-CM | POA: Diagnosis not present

## 2018-12-18 DIAGNOSIS — Z1231 Encounter for screening mammogram for malignant neoplasm of breast: Secondary | ICD-10-CM | POA: Diagnosis not present

## 2018-12-18 DIAGNOSIS — Z13820 Encounter for screening for osteoporosis: Secondary | ICD-10-CM | POA: Diagnosis not present

## 2018-12-18 DIAGNOSIS — Z6828 Body mass index (BMI) 28.0-28.9, adult: Secondary | ICD-10-CM | POA: Diagnosis not present

## 2019-01-01 DIAGNOSIS — Z20828 Contact with and (suspected) exposure to other viral communicable diseases: Secondary | ICD-10-CM | POA: Diagnosis not present

## 2019-03-05 DIAGNOSIS — Z20828 Contact with and (suspected) exposure to other viral communicable diseases: Secondary | ICD-10-CM | POA: Diagnosis not present

## 2019-03-10 ENCOUNTER — Encounter: Payer: Self-pay | Admitting: Family Medicine

## 2019-03-11 ENCOUNTER — Encounter: Payer: BLUE CROSS/BLUE SHIELD | Admitting: Family Medicine

## 2019-03-30 DIAGNOSIS — H401131 Primary open-angle glaucoma, bilateral, mild stage: Secondary | ICD-10-CM | POA: Diagnosis not present

## 2019-03-30 DIAGNOSIS — H524 Presbyopia: Secondary | ICD-10-CM | POA: Diagnosis not present

## 2019-03-30 DIAGNOSIS — H2513 Age-related nuclear cataract, bilateral: Secondary | ICD-10-CM | POA: Diagnosis not present

## 2019-07-27 DIAGNOSIS — L821 Other seborrheic keratosis: Secondary | ICD-10-CM | POA: Diagnosis not present

## 2019-07-27 DIAGNOSIS — D2262 Melanocytic nevi of left upper limb, including shoulder: Secondary | ICD-10-CM | POA: Diagnosis not present

## 2019-07-27 DIAGNOSIS — L812 Freckles: Secondary | ICD-10-CM | POA: Diagnosis not present

## 2019-07-27 DIAGNOSIS — L814 Other melanin hyperpigmentation: Secondary | ICD-10-CM | POA: Diagnosis not present

## 2019-08-29 DIAGNOSIS — H60391 Other infective otitis externa, right ear: Secondary | ICD-10-CM | POA: Diagnosis not present

## 2019-08-31 ENCOUNTER — Other Ambulatory Visit: Payer: Self-pay

## 2019-09-01 ENCOUNTER — Ambulatory Visit (INDEPENDENT_AMBULATORY_CARE_PROVIDER_SITE_OTHER): Payer: BC Managed Care – PPO | Admitting: Family Medicine

## 2019-09-01 ENCOUNTER — Encounter: Payer: Self-pay | Admitting: Family Medicine

## 2019-09-01 VITALS — BP 130/60 | HR 96 | Temp 98.6°F | Wt 151.8 lb

## 2019-09-01 DIAGNOSIS — H60501 Unspecified acute noninfective otitis externa, right ear: Secondary | ICD-10-CM

## 2019-09-01 DIAGNOSIS — H409 Unspecified glaucoma: Secondary | ICD-10-CM

## 2019-09-01 MED ORDER — LATANOPROST 0.005 % OP SOLN: 1.0000 [drp] | Freq: Every day | OPHTHALMIC | 12 refills | Status: AC

## 2019-09-01 NOTE — Progress Notes (Signed)
   Subjective:    Patient ID: Mercedes [name], female    DOB: 06/29/1960, 59 y.o.   MRN: [id number]  HPI Here to follow up an urgent care visit on 08-29-19 for an otitis externa. She presented with right ear pain, no fever or other symptoms. She was treated with Ciprodex drops , and she now feels much better. Only mild soreness remains. No hearing changes. Of note she was diagnosed with glaucoma last winter, and she takes Xalatan drops.    Review of Systems  Constitutional: Negative.   HENT: Positive for congestion, ear pain and sinus pressure. Negative for ear discharge, postnasal drip and sore throat.   Eyes: Negative.   Respiratory: Negative.        Objective:   Physical Exam Constitutional:      General: She is not in acute distress.    Appearance: Normal appearance.  HENT:     Head:     Comments: Right canal has slight erythema     Right Ear: Tympanic membrane normal.     Left Ear: Tympanic membrane, ear canal and external ear normal.     Mouth/Throat:     Pharynx: Oropharynx is clear.  Eyes:     Conjunctiva/sclera: Conjunctivae normal.  Pulmonary:     Effort: Pulmonary effort is normal.     Breath sounds: Normal breath sounds.  Lymphadenopathy:     Cervical: No cervical adenopathy.  Neurological:     Mental Status: She is alert.           Assessment & Plan:  Right otitis externa, partially treated. She will use the Ciprodex drops for 2-3 more days. Recheck at her well exam next month.  Alysia Penna, MD

## 2019-09-28 ENCOUNTER — Encounter: Payer: BC Managed Care – PPO | Admitting: Family Medicine

## 2019-09-29 DIAGNOSIS — H401131 Primary open-angle glaucoma, bilateral, mild stage: Secondary | ICD-10-CM | POA: Diagnosis not present

## 2019-10-27 ENCOUNTER — Other Ambulatory Visit: Payer: Self-pay

## 2019-10-27 ENCOUNTER — Telehealth (INDEPENDENT_AMBULATORY_CARE_PROVIDER_SITE_OTHER): Payer: BC Managed Care – PPO | Admitting: Family Medicine

## 2019-10-27 DIAGNOSIS — R197 Diarrhea, unspecified: Secondary | ICD-10-CM | POA: Diagnosis not present

## 2019-10-27 NOTE — Progress Notes (Signed)
Virtual Visit via Video Note  I connected with Mercedes [name]  on 10/27/19 at  1:20 PM EDT by a video enabled telemedicine application and verified that I am speaking with the correct person using two identifiers.  Location patient: home Location provider:work or home office Persons participating in the virtual visit: patient, provider  I discussed the limitations of evaluation and management by telemedicine and the availability of in person appointments. The patient expressed understanding and agreed to proceed.   HPI:  Acute visit for NVD: -started today -symptoms include: nausea, explosive diarrhea several times this morning, decreased appetite -she was supposed to do jury duty, she went but then got too sick and returned home, needs note -denies: melena, hematochezia, fever, abd pain, vomiting, inability to get out of bed or tolerate oral intake, recent abx, sore throat, cough, congestion, SOB, body aches -no known sick contacts, but has 5 grand babies -she did go to a football game this past week -Fully vaccinated for COVID19, pfizer, 2nd dose 1 month ago  ROS: See pertinent positives and negatives per HPI.  Past Medical History:  Diagnosis Date  . Allergy   . Anemia   . Glaucoma    sees Dr. [name]     Past Surgical History:  Procedure Laterality Date  . BREAST BIOPSY Left 2017   benign  . COLONOSCOPY  07/12/2011   per Dr. [name], adenomatous polyp - no polyps 2020, repeat 2030  . dental implant  2013  . KNEE ARTHROSCOPY  2004   ACL Repair  . MOUTH SURGERY     2 weeks ago- has a retainer in, off antibiotics  . OOPHORECTOMY  1996   left    Family History  Problem Relation Age of Onset  . Alcohol abuse Other   . Asthma Other   . Hyperlipidemia Other   . Hypertension Other   . Heart disease Other   . Breast cancer Maternal Aunt   . Colon cancer Neg Hx   . Stomach cancer Neg Hx   . Esophageal cancer Neg Hx   . Rectal cancer Neg Hx     SOCIAL HX: see  hpi   Current Outpatient Medications:  .  Estradiol (DIVIGEL) 1 MG/GM GEL, Place onto the skin. Rub on thigh at bedtime, Disp: , Rfl:  .  fluticasone (FLONASE) 50 MCG/ACT nasal spray, USE 2 SPRAYS IN EACH NOSTRIL DAILY FOR 14 DAYS, Disp: 16 g, Rfl: 1 .  ibuprofen (ADVIL,MOTRIN) 800 MG tablet, Take by mouth., Disp: , Rfl:  .  latanoprost (XALATAN) 0.005 % ophthalmic solution, Place 1 drop into both eyes at bedtime., Disp: 2.5 mL, Rfl: 12 .  Multiple Vitamins-Minerals (CENTRUM SILVER ULTRA WOMENS PO), Take by mouth daily., Disp: , Rfl:  .  progesterone (PROMETRIUM) 100 MG capsule, Take 100 mg by mouth at bedtime., Disp: , Rfl: 12  EXAM:  VITALS per patient if applicable:  GENERAL: alert, oriented, appears well and in no acute distress  HEENT: atraumatic, conjunttiva clear, no obvious abnormalities on inspection of external nose and ears  NECK: normal movements of the head and neck  LUNGS: on inspection no signs of respiratory distress, breathing rate appears normal, no obvious gross SOB, gasping or wheezing  CV: no obvious cyanosis  MS: moves all visible extremities without noticeable abnormality  PSYCH/NEURO: pleasant and cooperative, no obvious depression or anxiety, speech and thought processing grossly intact  ASSESSMENT AND PLAN:  Discussed the following assessment and plan:  Diarrhea, unspecified type  -we discussed possible  serious and likely etiologies, options for evaluation and workup, limitations of telemedicine visit vs in person visit, treatment, treatment risks and precautions. Pt prefers to treat via telemedicine empirically rather then risking or undertaking an in person visit at this moment. Query gastroenteritis vs other. Possible atypical covid given recent large crowded event - is fully vaccinated. Advised oral hydration, no dairy, imodium if needed. Precautions provided. Advised to stay home while sick. She has opted to do covid testing out of abundance of  caution - info provided for testing, what to do if positive, isolations, etc.  Work/School slipped offered: provided in patient instuctions Advised to seek prompt follow up telemedicine visit or in person care if worsening, new symptoms arise, or if is not improving with treatment.    I discussed the assessment and treatment plan with the patient. > 20 minutes spent on this visit.  The patient was provided an opportunity to ask questions and all were answered. The patient agreed with the plan and demonstrated an understanding of the instructions.      Lucretia Kern, DO

## 2019-10-27 NOTE — Patient Instructions (Signed)
  WORK/JURY DUTY SLIP:  Patient Mercedes [name],  Mar 10, 1960, was seen for a medical visit today, 10/27/2019. Please excuse from work/jury duty according to the CDC guidelines for a COVID like illness. We advise 10 days minimum from the onset of symptoms (10/27/19) PLUS 1 day of no fever and improved symptoms. Will defer to employer/court for a sooner return to work/jury dury if COVID19 testing is negative and the symptoms have resolved. Advise following CDC guidelines.    Sincerely: E-signature: Dr. Colin Benton, DO [facility] Ph: [phone]    ------------------------------------------------------------------------------------------------------------    Home Care Tips:  -stay home while sick, and if you have [place] please stay home for a full 10 days since the onset of symptoms PLUS one day of no fever and feeling better  -[place] COVID19 testing information: https://www.river[facility]-powers.org/ OR 270 439 1226  -can use imodium if needed for diarrhea per instructions  -stay hydrated, drink plenty of fluids and eat small healthy meals - avoid dairy while sick  -can take 1000 IU Vit D3 and Vit C lozenges per instructions   -follow up with your doctor in 2-3 days unless improving and feeling better  I hope you are feeling better soon! Seek in-person care or a follow up telemedicine visit promptly if your symptoms worsen, new concerns arise or you are not improving as expected. Call 911 if severe symptoms.

## 2019-11-02 ENCOUNTER — Encounter: Payer: BC Managed Care – PPO | Admitting: Family Medicine

## 2019-11-20 ENCOUNTER — Other Ambulatory Visit: Payer: Self-pay

## 2019-11-20 ENCOUNTER — Ambulatory Visit (INDEPENDENT_AMBULATORY_CARE_PROVIDER_SITE_OTHER): Payer: BC Managed Care – PPO | Admitting: Family Medicine

## 2019-11-20 ENCOUNTER — Encounter: Payer: Self-pay | Admitting: Family Medicine

## 2019-11-20 VITALS — BP 120/62 | HR 62 | Temp 98.3°F | Ht 62.0 in | Wt 145.2 lb

## 2019-11-20 DIAGNOSIS — Z0000 Encounter for general adult medical examination without abnormal findings: Secondary | ICD-10-CM | POA: Diagnosis not present

## 2019-11-20 NOTE — Progress Notes (Signed)
   Subjective:    Patient ID: Mercedes [name], female    DOB: Jun 29, 1960, 59 y.o.   MRN: [id number]  HPI Here for a well exam. She feels fine.   Review of Systems  Constitutional: Negative.   HENT: Negative.   Eyes: Negative.   Respiratory: Negative.   Cardiovascular: Negative.   Gastrointestinal: Negative.   Genitourinary: Negative for decreased urine volume, difficulty urinating, dyspareunia, dysuria, enuresis, flank pain, frequency, hematuria, pelvic pain and urgency.  Musculoskeletal: Negative.   Skin: Negative.   Neurological: Negative.   Psychiatric/Behavioral: Negative.        Objective:   Physical Exam Constitutional:      General: She is not in acute distress.    Appearance: She is well-developed.  HENT:     Head: Normocephalic and atraumatic.     Right Ear: External ear normal.     Left Ear: External ear normal.     Nose: Nose normal.     Mouth/Throat:     Pharynx: No oropharyngeal exudate.  Eyes:     General: No scleral icterus.    Conjunctiva/sclera: Conjunctivae normal.     Pupils: Pupils are equal, round, and reactive to light.  Neck:     Thyroid: No thyromegaly.     Vascular: No JVD.  Cardiovascular:     Rate and Rhythm: Normal rate and regular rhythm.     Heart sounds: Normal heart sounds. No murmur heard.  No friction rub. No gallop.   Pulmonary:     Effort: Pulmonary effort is normal. No respiratory distress.     Breath sounds: Normal breath sounds. No wheezing or rales.  Chest:     Chest wall: No tenderness.  Abdominal:     General: Bowel sounds are normal. There is no distension.     Palpations: Abdomen is soft. There is no mass.     Tenderness: There is no abdominal tenderness. There is no guarding or rebound.  Musculoskeletal:        General: No tenderness. Normal range of motion.     Cervical back: Normal range of motion and neck supple.  Lymphadenopathy:     Cervical: No cervical adenopathy.  Skin:    General: Skin is warm and dry.       Findings: No erythema or rash.  Neurological:     Mental Status: She is alert and oriented to person, place, and time.     Cranial Nerves: No cranial nerve deficit.     Motor: No abnormal muscle tone.     Coordination: Coordination normal.     Deep Tendon Reflexes: Reflexes are normal and symmetric. Reflexes normal.  Psychiatric:        Behavior: Behavior normal.        Thought Content: Thought content normal.        Judgment: Judgment normal.           Assessment & Plan:  Well exam. We discussed diet and exercise. Get fasting labs. Alysia Penna, MD

## 2019-11-21 LAB — CBC WITH DIFFERENTIAL/PLATELET
Absolute Monocytes: 350 cells/uL (ref 200–950)
Basophils Absolute: 51 cells/uL (ref 0–200)
Basophils Relative: 1.1 %
Eosinophils Absolute: 120 cells/uL (ref 15–500)
Eosinophils Relative: 2.6 %
HCT: 38.9 % (ref 35.0–45.0)
Hemoglobin: 13 g/dL (ref 11.7–15.5)
Lymphs Abs: 1233 cells/uL (ref 850–3900)
MCH: 29.8 pg (ref 27.0–33.0)
MCHC: 33.4 g/dL (ref 32.0–36.0)
MCV: 89.2 fL (ref 80.0–100.0)
MPV: 10.4 fL (ref 7.5–12.5)
Monocytes Relative: 7.6 %
Neutro Abs: 2847 cells/uL (ref 1500–7800)
Neutrophils Relative %: 61.9 %
Platelets: 290 10*3/uL (ref 140–400)
RBC: 4.36 10*6/uL (ref 3.80–5.10)
RDW: 12.9 % (ref 11.0–15.0)
Total Lymphocyte: 26.8 %
WBC: 4.6 10*3/uL (ref 3.8–10.8)

## 2019-11-21 LAB — HEPATIC FUNCTION PANEL
AG Ratio: 1.8 (calc) (ref 1.0–2.5)
ALT: 13 U/L (ref 6–29)
AST: 19 U/L (ref 10–35)
Albumin: 4.4 g/dL (ref 3.6–5.1)
Alkaline phosphatase (APISO): 59 U/L (ref 37–153)
Bilirubin, Direct: 0.1 mg/dL (ref 0.0–0.2)
Globulin: 2.4 g/dL (calc) (ref 1.9–3.7)
Indirect Bilirubin: 0.5 mg/dL (calc) (ref 0.2–1.2)
Total Bilirubin: 0.6 mg/dL (ref 0.2–1.2)
Total Protein: 6.8 g/dL (ref 6.1–8.1)

## 2019-11-21 LAB — LIPID PANEL
Cholesterol: 163 mg/dL (ref <200)
HDL: 71 mg/dL (ref >=50)
LDL Cholesterol (Calc): 75 mg/dL (calc)
Non-HDL Cholesterol (Calc): 92 mg/dL (calc) (ref <130)
Total CHOL/HDL Ratio: 2.3 (calc) (ref <5.0)
Triglycerides: 91 mg/dL (ref <150)

## 2019-11-21 LAB — BASIC METABOLIC PANEL
BUN: 23 mg/dL (ref 7–25)
CO2: 27 mmol/L (ref 20–32)
Calcium: 9.3 mg/dL (ref 8.6–10.4)
Chloride: 103 mmol/L (ref 98–110)
Creat: 0.84 mg/dL (ref 0.50–0.99)
Glucose, Bld: 96 mg/dL (ref 65–99)
Potassium: 4.1 mmol/L (ref 3.5–5.3)
Sodium: 137 mmol/L (ref 135–146)

## 2019-11-21 LAB — TSH: TSH: 3.06 mIU/L (ref 0.40–4.50)

## 2020-01-05 ENCOUNTER — Other Ambulatory Visit: Payer: Self-pay

## 2020-01-06 ENCOUNTER — Ambulatory Visit (INDEPENDENT_AMBULATORY_CARE_PROVIDER_SITE_OTHER): Payer: BC Managed Care – PPO | Admitting: Family Medicine

## 2020-01-06 ENCOUNTER — Other Ambulatory Visit: Payer: Self-pay

## 2020-01-06 ENCOUNTER — Encounter: Payer: Self-pay | Admitting: Family Medicine

## 2020-01-06 VITALS — BP 118/66 | HR 76 | Temp 97.9°F | Ht 62.0 in | Wt 140.6 lb

## 2020-01-06 DIAGNOSIS — S76311A Strain of muscle, fascia and tendon of the posterior muscle group at thigh level, right thigh, initial encounter: Secondary | ICD-10-CM

## 2020-01-06 NOTE — Progress Notes (Signed)
   Subjective:    Patient ID: Mercedes [name], female    DOB: 1960/03/08, 59 y.o.   MRN: [id number]  HPI Here for 2 weeks of tightness and pain in the right posterior thigh. This started when she sprinted to get to a short ball while playing tennis. It has bothered her since then. She takes Ibuprofen with good abut brief relief. No swelling in the leg.    Review of Systems  Respiratory: Negative.   Cardiovascular: Negative.   Musculoskeletal: Positive for myalgias.       Objective:   Physical Exam Constitutional:      Appearance: Normal appearance. She is not ill-appearing.  Cardiovascular:     Rate and Rhythm: Normal rate and regular rhythm.     Pulses: Normal pulses.     Heart sounds: Normal heart sounds.  Musculoskeletal:     Right lower leg: No edema.     Left lower leg: No edema.     Comments: She is tender in the right posterior thigh just below the buttock   Neurological:     Mental Status: She is alert.           Assessment & Plan:  Hamstring strain. This will heal over the next 4-6 weeks but she needs to give it rest. She should not exercise or run on it for about one month.  Alysia Penna, MD

## 2020-01-20 DIAGNOSIS — M7918 Myalgia, other site: Secondary | ICD-10-CM | POA: Diagnosis not present

## 2020-01-20 DIAGNOSIS — M25671 Stiffness of right ankle, not elsewhere classified: Secondary | ICD-10-CM | POA: Diagnosis not present

## 2020-01-20 DIAGNOSIS — M9906 Segmental and somatic dysfunction of lower extremity: Secondary | ICD-10-CM | POA: Diagnosis not present

## 2020-01-20 DIAGNOSIS — M76811 Anterior tibial syndrome, right leg: Secondary | ICD-10-CM | POA: Diagnosis not present

## 2020-01-25 DIAGNOSIS — Z6825 Body mass index (BMI) 25.0-25.9, adult: Secondary | ICD-10-CM | POA: Diagnosis not present

## 2020-01-25 DIAGNOSIS — Z1231 Encounter for screening mammogram for malignant neoplasm of breast: Secondary | ICD-10-CM | POA: Diagnosis not present

## 2020-01-25 DIAGNOSIS — Z01419 Encounter for gynecological examination (general) (routine) without abnormal findings: Secondary | ICD-10-CM | POA: Diagnosis not present

## 2020-01-26 DIAGNOSIS — M25671 Stiffness of right ankle, not elsewhere classified: Secondary | ICD-10-CM | POA: Diagnosis not present

## 2020-01-26 DIAGNOSIS — M7918 Myalgia, other site: Secondary | ICD-10-CM | POA: Diagnosis not present

## 2020-01-26 DIAGNOSIS — M76811 Anterior tibial syndrome, right leg: Secondary | ICD-10-CM | POA: Diagnosis not present

## 2020-01-26 DIAGNOSIS — M9906 Segmental and somatic dysfunction of lower extremity: Secondary | ICD-10-CM | POA: Diagnosis not present

## 2020-03-08 DIAGNOSIS — M1711 Unilateral primary osteoarthritis, right knee: Secondary | ICD-10-CM | POA: Diagnosis not present

## 2020-03-08 DIAGNOSIS — M25551 Pain in right hip: Secondary | ICD-10-CM | POA: Diagnosis not present

## 2020-03-08 DIAGNOSIS — M545 Low back pain, unspecified: Secondary | ICD-10-CM | POA: Diagnosis not present

## 2020-03-08 DIAGNOSIS — M25571 Pain in right ankle and joints of right foot: Secondary | ICD-10-CM | POA: Diagnosis not present

## 2020-03-15 ENCOUNTER — Ambulatory Visit: Payer: BC Managed Care – PPO | Admitting: Family Medicine

## 2020-03-29 NOTE — Progress Notes (Signed)
Mercedes Mercedes [name] Phone: [phone] Subjective:   Mercedes Mercedes [name], am serving as a scribe for Dr. Hulan Mercedes [name]. This visit occurred during the SARS-CoV-2 public health emergency.  Safety protocols were in place, including screening questions prior to the visit, additional usage of staff PPE, and extensive cleaning of exam room while observing appropriate contact time as indicated for disinfecting solutions.   I'm seeing this patient by the request  of:  Mercedes Morale, MD  CC: Right knee pain and right foot pain  Mercedes [name]:Mercedes Mercedes [name]  Mercedes Mercedes [name] is a 60 y.o. female coming in with complaint of right knee pain and foot pain. Patient suffered hamstring injury on left side in November when she sprinted towards a short ball while playing tennis. Top of right foot started to bother her around the same time. Then began haivng pain in back of right knee. Pain has subsided since then. Was seen at [facility] 6 weeks ago. Had xrays and found that she has osteoarthritis in right knee. 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 In last month is now dancing inside on the wood.   Patient states that she had ACL and torn mensicus repair in 2004.         Past Medical History:  Diagnosis Date  . Allergy   . Anemia   . Glaucoma    sees Dr. Jola Mercedes [name]    Past Surgical History:  Procedure Laterality Date  . BREAST BIOPSY Left 2017   benign  . COLONOSCOPY  02/25/2018   per Dr. Carlean Mercedes [name], Mercedes [name] polyps, repeat in 10 yrs   . dental implant  2013  . KNEE ARTHROSCOPY  2004   ACL Repair  . MOUTH SURGERY     2 weeks ago- has a retainer in, off antibiotics  . OOPHORECTOMY  1996   left   Social History   Socioeconomic History  . Marital status: Married    Spouse name: Not on file  . Number of children: Not on file  . Years of education: Not on file  . Highest education level: Not on file   Occupational History  . Not on file  Tobacco Use  . Smoking status: Never Smoker  . Smokeless tobacco: Never Used  Substance and Sexual Activity  . Alcohol use: Yes    Alcohol/week: 3.0 standard drinks    Types: 3 Standard drinks or equivalent per week    Comment: occ  . Drug use: Mercedes [name]  . Sexual activity: Not on file  Other Topics Concern  . Not on file  Social History Narrative  . Not on file   Social Determinants of Health   Financial Resource Strain: Not on file  Food Insecurity: Not on file  Transportation Needs: Not on file  Physical Activity: Not on file  Stress: Not on file  Social Connections: Not on file   Allergies  Allergen Reactions  . Penicillins     Per pt: allergic as child  . Sulfamethoxazole-Trimethoprim Hives   Family History  Problem Relation Age of Onset  . Alcohol abuse Other   . Asthma Other   . Hyperlipidemia Other   . Hypertension Other   . Heart disease Other   . Breast cancer Maternal Aunt   . Colon cancer Neg Hx   . Stomach cancer Neg Hx   . Esophageal cancer Neg Hx   . Rectal  cancer Neg Hx     Current Outpatient Medications (Endocrine & Metabolic):  .  Estradiol 1 MG/GM GEL, Place onto the skin. Rub on thigh at bedtime .  progesterone (PROMETRIUM) 100 MG capsule, Take 100 mg by mouth at bedtime.   Current Outpatient Medications (Respiratory):  .  fluticasone (FLONASE) 50 MCG/ACT nasal spray, USE 2 SPRAYS IN EACH NOSTRIL DAILY FOR 14 DAYS  Current Outpatient Medications (Analgesics):  .  ibuprofen (ADVIL,MOTRIN) 800 MG tablet, Take by mouth.   Current Outpatient Medications (Other):  .  latanoprost (XALATAN) 0.005 % ophthalmic solution, Place 1 drop into both eyes at bedtime. .  Multiple Vitamins-Minerals (CENTRUM SILVER ULTRA WOMENS PO), Take by mouth daily.   Reviewed prior external information including notes and imaging from  primary care provider As well as notes that were available from care everywhere and other  healthcare systems.  Past medical history, social, surgical and family history all reviewed in electronic medical record.  Mercedes [name] pertanent information unless stated regarding to the chief complaint.   Review of Systems:  Mercedes [name] headache, visual changes, nausea, vomiting, diarrhea, constipation, dizziness, abdominal pain, skin rash, fevers, chills, night sweats, weight loss, swollen lymph nodes, body aches, joint swelling, chest pain, shortness of breath, mood changes. POSITIVE muscle aches  Objective  Blood pressure 128/86, pulse 79, height 5\' 2"  (1.575 m), weight 140 lb (63.5 kg), SpO2 97 %.   General: Mercedes [name] apparent distress alert and oriented x3 mood and affect normal, dressed appropriately.  HEENT: Pupils equal, extraocular movements intact  Respiratory: Patient's speak in full sentences and does not appear short of breath  Cardiovascular: Mercedes [name] lower extremity edema, non tender, Mercedes [name] erythema  Gait normal with good balance and coordination.  MSK:   Right knee exam shows the patient does have tenderness to palpation minorly over the patellofemoral joint.  Mild lateral tracking of the patella noted.  Mercedes [name] significant instability.  Mild pain over the medial joint space.  Full range of motion noted.  Right foot exam shows that patient does have breakdown of the transverse arch.  Rigid midfoot noted.  Good range of motion of the ankle noted.  Mild pain between the first and second toes with mild positive squeeze test.   Limited musculoskeletal ultrasound was performed and interpreted by Mercedes Mercedes [name]  Limited ultrasound of patient's right knee shows some mild narrowing noted at the patellofemoral joint and moderate narrowing of the medial joint space.  Patient does have some degenerative changes of the medial meniscus but Mercedes [name] true displacement noted.   Impression and Recommendations:     The above documentation has been reviewed and is accurate and complete Mercedes Pulley, DO

## 2020-03-30 ENCOUNTER — Other Ambulatory Visit: Payer: Self-pay

## 2020-03-30 ENCOUNTER — Ambulatory Visit (INDEPENDENT_AMBULATORY_CARE_PROVIDER_SITE_OTHER): Payer: BC Managed Care – PPO | Admitting: Family Medicine

## 2020-03-30 ENCOUNTER — Ambulatory Visit (INDEPENDENT_AMBULATORY_CARE_PROVIDER_SITE_OTHER): Payer: BC Managed Care – PPO

## 2020-03-30 ENCOUNTER — Ambulatory Visit: Payer: Self-pay

## 2020-03-30 ENCOUNTER — Encounter: Payer: Self-pay | Admitting: Family Medicine

## 2020-03-30 VITALS — BP 128/86 | HR 79 | Ht 62.0 in | Wt 140.0 lb

## 2020-03-30 DIAGNOSIS — M25561 Pain in right knee: Secondary | ICD-10-CM

## 2020-03-30 DIAGNOSIS — M79671 Pain in right foot: Secondary | ICD-10-CM | POA: Diagnosis not present

## 2020-03-30 DIAGNOSIS — M7731 Calcaneal spur, right foot: Secondary | ICD-10-CM | POA: Diagnosis not present

## 2020-03-30 DIAGNOSIS — M25762 Osteophyte, left knee: Secondary | ICD-10-CM | POA: Diagnosis not present

## 2020-03-30 NOTE — Patient Instructions (Addendum)
Xray today Exercises [employer]otics Total Support Recover Sandals-HOKA or OOFOS Check back with me in 2 months if you need me

## 2020-03-30 NOTE — Assessment & Plan Note (Signed)
Patient does have right knee pain, seems to be worse with certain activities including twisting motions or when she is wearing the cowboy boots.  Discussed with patient I do think that that is contributing to some of the aches and pains.  We discussed proper shoes, home exercises working on VMO, x-rays pending.  I believe patient will do relatively well with conservative therapy.  Follow-up with me again in 4 to 6 weeks.  Worsening pain consider formal physical therapy before injections.

## 2020-03-30 NOTE — Assessment & Plan Note (Signed)
I do not believe the patient has any type of stress reaction.  Patient will likely has a mild neuroma.  We discussed proper lacing her shoes, recovery sandals, exercises minorly for the transverse arch.  Discussed over-the-counter orthotics that I think will be beneficial.  Patient will follow up again in 4 to 6 weeks.  Worsening pain consider ultrasound with potential injections patient may be a candidate for custom orthotics at a later date

## 2020-04-08 DIAGNOSIS — H401131 Primary open-angle glaucoma, bilateral, mild stage: Secondary | ICD-10-CM | POA: Diagnosis not present

## 2020-06-01 ENCOUNTER — Ambulatory Visit: Payer: Self-pay

## 2020-06-01 ENCOUNTER — Other Ambulatory Visit: Payer: Self-pay

## 2020-06-01 ENCOUNTER — Ambulatory Visit (INDEPENDENT_AMBULATORY_CARE_PROVIDER_SITE_OTHER): Payer: BC Managed Care – PPO | Admitting: Family Medicine

## 2020-06-01 ENCOUNTER — Encounter: Payer: Self-pay | Admitting: Family Medicine

## 2020-06-01 VITALS — BP 130/80 | HR 75 | Ht 62.0 in | Wt 141.0 lb

## 2020-06-01 DIAGNOSIS — G8929 Other chronic pain: Secondary | ICD-10-CM | POA: Diagnosis not present

## 2020-06-01 DIAGNOSIS — M79671 Pain in right foot: Secondary | ICD-10-CM | POA: Diagnosis not present

## 2020-06-01 DIAGNOSIS — M1711 Unilateral primary osteoarthritis, right knee: Secondary | ICD-10-CM | POA: Diagnosis not present

## 2020-06-01 DIAGNOSIS — M25561 Pain in right knee: Secondary | ICD-10-CM | POA: Diagnosis not present

## 2020-06-01 NOTE — Patient Instructions (Signed)
Good to see you Injected the knee today Continue exercises Ice the knee before bed and 20 mins after activity See me again in 6 weeks

## 2020-06-01 NOTE — Assessment & Plan Note (Signed)
Patient did have aspiration done today.  Patient did have a large effusion.  Responded very well to the aspiration and hopefully patient will feel better soon.  We discussed though the patient does have moderate arthritic changes and does have a degenerative meniscal tear noted.  If this continues to give her trouble we do need to consider the possibility of advanced imaging.  We will send patient's fluid to the lab to make sure there is no gout that could be also contributing.  Discussed with patient that if any swelling, redness, to seek medical attention immediately or call us.  Athletic trainer was in the room.  Follow-up with me again in 6 weeks

## 2020-06-01 NOTE — Assessment & Plan Note (Signed)
Foot pain seems to be improved at this time.  We will continue to monitor.  Likely does have a neuroma but seems to be doing better.

## 2020-06-01 NOTE — Progress Notes (Signed)
[facility] [address] Gisela [place] Phone: [phone] Subjective:   I Mercedes [name] am serving as a Education administrator for Dr. Hulan Saas.  This visit occurred during the SARS-CoV-2 public health emergency.  Safety protocols were in place, including screening questions prior to the visit, additional usage of staff PPE, and extensive cleaning of exam room while observing appropriate contact time as indicated for disinfecting solutions.   I'm seeing this patient by the request  of:  [name], MD  CC: Right knee and right foot follow-up  HYW:[id number]   03/30/2020 I do not believe the patient has any type of stress reaction.  Patient will likely has a mild neuroma.  We discussed proper lacing her shoes, recovery sandals, exercises minorly for the transverse arch.  Discussed over-the-counter orthotics that I think will be beneficial.  Patient will follow up again in 4 to 6 weeks.  Worsening pain consider ultrasound with potential injections patient may be a candidate for custom orthotics at a later date  Patient does have right knee pain, seems to be worse with certain activities including twisting motions or when she is wearing the cowboy boots.  Discussed with patient I do think that that is contributing to some of the aches and pains.  We discussed proper shoes, home exercises working on VMO, x-rays pending.  I believe patient will do relatively well with conservative therapy.  Follow-up with me again in 4 to 6 weeks.  Worsening pain consider formal physical therapy before injections.  Update 06/01/2020 Mercedes [name] is a 60 y.o. female coming in with complaint of R knee and R foot pain. Patient states her foot doesn't bother her any more. Posterior right knee pain. Believes the knee is getting better. Pain comes and goes. Yesterday morning she could not do a hamstring curl after dancing the night before. Tried riding her bike and noticed her knee did  not have good ROM. Stopped body pump when she started doing more advanced dancing. Wants to know if her muscle strength is decreasing. Has been going to strength training classes. Has been doing the home exercises. Taking meloxicam once a day. Patient likes meloxicam and states she would like to take 2 pills but is scared of side effects. Overall knee is better and not waking her up at night just tightness behind the knee. Icing the knee and foot after dance.    Right foot x-ray was independently visualized by me showing calcaneal spurs but otherwise no significant finding.  Calcaneal spurs are posterior and inferior  Bilateral knee x-rays were also independently visualized by me showing the patient does have moderate narrowing noted of the medial joint space  Past Medical History:  Diagnosis Date  . Allergy   . Anemia   . Glaucoma    sees Dr. [name]    Past Surgical History:  Procedure Laterality Date  . BREAST BIOPSY Left 2017   benign  . COLONOSCOPY  02/25/2018   per Dr. [name], no polyps, repeat in 10 yrs   . dental implant  2013  . KNEE ARTHROSCOPY  2004   ACL Repair  . MOUTH SURGERY     2 weeks ago- has a retainer in, off antibiotics  . OOPHORECTOMY  1996   left   Social History   Socioeconomic History  . Marital status: Married    Spouse name: Not on file  . Number of children: Not on file  . Years of education:  Not on file  . Highest education level: Not on file  Occupational History  . Not on file  Tobacco Use  . Smoking status: Never Smoker  . Smokeless tobacco: Never Used  Substance and Sexual Activity  . Alcohol use: Yes    Alcohol/week: 3.0 standard drinks    Types: 3 Standard drinks or equivalent per week    Comment: occ  . Drug use: No  . Sexual activity: Not on file  Other Topics Concern  . Not on file  Social History Narrative  . Not on file   Social Determinants of Health   Financial Resource Strain: Not on file  Food Insecurity: Not on  file  Transportation Needs: Not on file  Physical Activity: Not on file  Stress: Not on file  Social Connections: Not on file   Allergies  Allergen Reactions  . Penicillins     Per pt: allergic as child  . Sulfamethoxazole-Trimethoprim Hives   Family History  Problem Relation Age of Onset  . Alcohol abuse Other   . Asthma Other   . Hyperlipidemia Other   . Hypertension Other   . Heart disease Other   . Breast cancer Maternal Aunt   . Colon cancer Neg Hx   . Stomach cancer Neg Hx   . Esophageal cancer Neg Hx   . Rectal cancer Neg Hx     Current Outpatient Medications (Endocrine & Metabolic):  .  Estradiol 1 MG/GM GEL, Place onto the skin. Rub on thigh at bedtime .  progesterone (PROMETRIUM) 100 MG capsule, Take 100 mg by mouth at bedtime.   Current Outpatient Medications (Respiratory):  .  fluticasone (FLONASE) 50 MCG/ACT nasal spray, USE 2 SPRAYS IN EACH NOSTRIL DAILY FOR 14 DAYS  Current Outpatient Medications (Analgesics):  .  ibuprofen (ADVIL,MOTRIN) 800 MG tablet, Take by mouth.   Current Outpatient Medications (Other):  .  latanoprost (XALATAN) 0.005 % ophthalmic solution, Place 1 drop into both eyes at bedtime. .  Multiple Vitamins-Minerals (CENTRUM SILVER ULTRA WOMENS PO), Take by mouth daily.   Reviewed prior external information including notes and imaging from  primary care provider As well as notes that were available from care everywhere and other healthcare systems.  Past medical history, social, surgical and family history all reviewed in electronic medical record.  No pertanent information unless stated regarding to the chief complaint.   Review of Systems:  No headache, visual changes, nausea, vomiting, diarrhea, constipation, dizziness, abdominal pain, skin rash, fevers, chills, night sweats, weight loss, swollen lymph nodes, body aches, joint swelling, chest pain, shortness of breath, mood changes. POSITIVE muscle aches  Objective  Blood pressure  130/80, pulse 75, height 5\' 2"  (1.575 m), weight 141 lb (64 kg), SpO2 97 %.   General: No apparent distress alert and oriented x3 mood and affect normal, dressed appropriately.  HEENT: Pupils equal, extraocular movements intact  Respiratory: Patient's speak in full sentences and does not appear short of breath  Cardiovascular: No lower extremity edema, non tender, no erythema  Gait antalgic MSK: Patient's right knee does have a large effusion compared to the contralateral side.  Mild lateral tracking of the patella noted.  Tenderness to palpation in the medial joint space.  Negative Thompson.  No pain in the calf itself.  Procedure: Real-time Ultrasound Guided Injection of right knee Device: GE Logiq Q7 Ultrasound guided injection is preferred based studies that show increased duration, increased effect, greater accuracy, decreased procedural pain, increased response rate, and decreased cost with ultrasound  guided versus blind injection.  Verbal informed consent obtained.  Time-out conducted.  Noted no overlying erythema, induration, or other signs of local infection.  Skin prepped in a sterile fashion.  Local anesthesia: Topical Ethyl chloride.  With sterile technique and under real time ultrasound guidance: With a 22-gauge 2 inch needle patient was injected with 4 cc of 0.5% Marcaine and aspirated 35 cc of straw light-colored fluid then injected 1 cc of Kenalog 40 mg/dL. This was from a superior lateral approach.  Completed without difficulty  Pain immediately resolved suggesting accurate placement of the medication.  Advised to call if fevers/chills, erythema, induration, drainage, or persistent bleeding.  Impression: Technically successful ultrasound guided injection.    Impression and Recommendations:     The above documentation has been reviewed and is accurate and complete [name], DO

## 2020-06-02 LAB — SYNOVIAL FLUID ANALYSIS, COMPLETE
Basophils, %: 0 %
Eosinophils-Synovial: 0 % (ref 0–2)
Lymphocytes-Synovial Fld: 80 % — ABNORMAL HIGH (ref 0–74)
Monocyte/Macrophage: 20 % (ref 0–69)
Neutrophil, Synovial: 0 % (ref 0–24)
Synoviocytes, %: 0 % (ref 0–15)
WBC, Synovial: 184 cells/uL — ABNORMAL HIGH (ref <150)

## 2020-06-02 LAB — TIQ-NTM

## 2020-06-16 ENCOUNTER — Telehealth: Payer: Self-pay | Admitting: Family Medicine

## 2020-06-16 ENCOUNTER — Other Ambulatory Visit: Payer: Self-pay

## 2020-06-16 DIAGNOSIS — M25561 Pain in right knee: Secondary | ICD-10-CM

## 2020-06-16 NOTE — Telephone Encounter (Signed)
Referral placed and patient notified.  

## 2020-06-16 NOTE — Telephone Encounter (Signed)
Patient called asking if a referral could be sent over to Dr Joni Fears at [facility]. She said that her knee is feeling much better but she would like to get a second opinion.  Please advise.

## 2020-06-22 ENCOUNTER — Ambulatory Visit (INDEPENDENT_AMBULATORY_CARE_PROVIDER_SITE_OTHER): Payer: BC Managed Care – PPO | Admitting: Orthopaedic Surgery

## 2020-06-22 ENCOUNTER — Other Ambulatory Visit: Payer: Self-pay

## 2020-06-22 ENCOUNTER — Encounter: Payer: Self-pay | Admitting: Orthopaedic Surgery

## 2020-06-22 VITALS — Ht 62.0 in | Wt 140.0 lb

## 2020-06-22 DIAGNOSIS — M25561 Pain in right knee: Secondary | ICD-10-CM | POA: Diagnosis not present

## 2020-06-22 DIAGNOSIS — G8929 Other chronic pain: Secondary | ICD-10-CM | POA: Diagnosis not present

## 2020-06-22 NOTE — Progress Notes (Signed)
Office Visit Note   Patient: Mercedes [name]           Date of Birth: [birth year]           MRN: [id number] Visit Date: 06/22/2020              Requested by: [name], DO [place],  [place] [address] PCP: [name], MD   Assessment & Plan: Visit Diagnoses:  1. Chronic pain of right knee     Plan: [name] experienced insidious onset of right knee pain in November 2021.  She eventually was seen by Dr. Hulan Saas last month who "drained my knee and injected cortisone."  Since that time she has not experienced any further problems with her knee nor does she have any compromise of any of her activities.  She simply was seeking another opinion to be sure that she was not missing "something".  I did review her films in the PACS system and she does have medial compartment degenerative changes in both knees.  She is not symptomatic on the left but I feel sure that her problem was related to the arthritis.  We have had a long discussion regarding use of NSAIDs and what she may expect over time.  Presently she is doing fine I would not suggest any further treatment or further diagnostic testing at this time.  All questions were answered Follow-Up Instructions: Return if symptoms worsen or fail to improve.   Orders:  No orders of the defined types were placed in this encounter.  No orders of the defined types were placed in this encounter.     Procedures: No procedures performed   Clinical Data: No additional findings.   Subjective: Chief Complaint  Patient presents with  . Right Knee - Pain  Patient presents today for right knee pain. She said that it started in November and only hurt on and off. She said that she saw Dr.Smith and had her knee aspirated and injected with cortisone one month ago. She is "feeling so much better". She takes Meloxicam as needed. X-rays are in the PACS system.  AP of both knees demonstrate about 1 degree of varus and  narrowing of the medial compartment with subchondral sclerosis on both sides of the joint and peripheral osteophytes.  No acute changes or ectopic calcification. Has had prior ACL reconstruction about 18 years ago on the left by Dr. Theda Sers and is doing quite well without any problems. Has not experienced problems with other joints  HPI  Review of Systems   Objective: Vital Signs: Ht 5\' 2"  (1.575 m)   Wt 140 lb (63.5 kg)   BMI 25.61 kg/m   Physical Exam Constitutional:      Appearance: She is well-developed.  Eyes:     Pupils: Pupils are equal, round, and reactive to light.  Pulmonary:     Effort: Pulmonary effort is normal.  Skin:    General: Skin is warm and dry.  Neurological:     Mental Status: She is alert and oriented to person, place, and time.  Psychiatric:        Behavior: Behavior normal.     Ortho Exam right knee was not hot red warm or swollen.  No effusion.  No medial or lateral joint pain.  No patella discomfort.  Full extension and flexion well over 105 degrees without instability.  No popliteal pain or mass or calf pain.  Straight leg raise negative.  Painless  range of motion both hips  Specialty Comments:  No specialty comments available.  Imaging: No results found.   PMFS History: Patient Active Problem List   Diagnosis Date Noted  . Unilateral primary osteoarthritis, right knee 06/01/2020  . Right knee pain 03/30/2020  . Right foot pain 03/30/2020  . Glaucoma 09/01/2019  . Personal history of colonic adenoma 07/17/2011  . ALLERGIC RHINITIS 03/02/2009  . ANEMIA-NOS 12/23/2006   Past Medical History:  Diagnosis Date  . Allergy   . Anemia   . Glaucoma    sees Dr. [name]     Family History  Problem Relation Age of Onset  . Alcohol abuse Other   . Asthma Other   . Hyperlipidemia Other   . Hypertension Other   . Heart disease Other   . Breast cancer Maternal Aunt   . Colon cancer Neg Hx   . Stomach cancer Neg Hx   . Esophageal  cancer Neg Hx   . Rectal cancer Neg Hx     Past Surgical History:  Procedure Laterality Date  . BREAST BIOPSY Left 2017   benign  . COLONOSCOPY  02/25/2018   per Dr. [name], no polyps, repeat in 10 yrs   . dental implant  2013  . KNEE ARTHROSCOPY  2004   ACL Repair  . MOUTH SURGERY     2 weeks ago- has a retainer in, off antibiotics  . OOPHORECTOMY  1996   left   Social History   Occupational History  . Not on file  Tobacco Use  . Smoking status: Never Smoker  . Smokeless tobacco: Never Used  Substance and Sexual Activity  . Alcohol use: Yes    Alcohol/week: 3.0 standard drinks    Types: 3 Standard drinks or equivalent per week    Comment: occ  . Drug use: No  . Sexual activity: Not on file

## 2020-07-12 NOTE — Progress Notes (Signed)
[place] [address] [place] Phone: [phone] Subjective:   I Mercedes [name] am serving as a Education administrator for Dr. Hulan Saas.  This visit occurred during the SARS-CoV-2 public health emergency.  Safety protocols were in place, including screening questions prior to the visit, additional usage of staff PPE, and extensive cleaning of exam room while observing appropriate contact time as indicated for disinfecting solutions.   I'm seeing this patient by the request  of:  [name], MD  CC: Right knee pain follow-up  [id number]  Mercedes [name] is a 60 y.o. female coming in with complaint of right knee pain.  Patient did have aspiration done of the knee previously on June 01, 2020.  Patient was feeling better but did want a second opinion about potential surgical intervention.  Saw orthopedic surgeon said to continue conservative therapy.  Patient states the knee is improving. Still achy. Still exercising taking 2 Advil or meloxicam. Would like another injection today.    Patient's knee x-rays from February 2022 were independently visualized by me showing the patient did have bilateral narrowing of the medial joint space moderate in nature.  Past Medical History:  Diagnosis Date  . Allergy   . Anemia   . Glaucoma    sees Dr. [name]    Past Surgical History:  Procedure Laterality Date  . BREAST BIOPSY Left 2017   benign  . COLONOSCOPY  02/25/2018   per Dr. [name], no polyps, repeat in 10 yrs   . dental implant  2013  . KNEE ARTHROSCOPY  2004   ACL Repair  . MOUTH SURGERY     2 weeks ago- has a retainer in, off antibiotics  . OOPHORECTOMY  1996   left   Social History   Socioeconomic History  . Marital status: Married    Spouse name: Not on file  . Number of children: Not on file  . Years of education: Not on file  . Highest education level: Not on file  Occupational History  . Not on file  Tobacco Use   . Smoking status: Never Smoker  . Smokeless tobacco: Never Used  Substance and Sexual Activity  . Alcohol use: Yes    Alcohol/week: 3.0 standard drinks    Types: 3 Standard drinks or equivalent per week    Comment: occ  . Drug use: No  . Sexual activity: Not on file  Other Topics Concern  . Not on file  Social History Narrative  . Not on file   Social Determinants of Health   Financial Resource Strain: Not on file  Food Insecurity: Not on file  Transportation Needs: Not on file  Physical Activity: Not on file  Stress: Not on file  Social Connections: Not on file   Allergies  Allergen Reactions  . Penicillins     Per pt: allergic as child  . Sulfamethoxazole-Trimethoprim Hives   Family History  Problem Relation Age of Onset  . Alcohol abuse Other   . Asthma Other   . Hyperlipidemia Other   . Hypertension Other   . Heart disease Other   . Breast cancer Maternal Aunt   . Colon cancer Neg Hx   . Stomach cancer Neg Hx   . Esophageal cancer Neg Hx   . Rectal cancer Neg Hx     Current Outpatient Medications (Endocrine & Metabolic):  .  Estradiol 1 MG/GM GEL, Place onto the skin. Rub on thigh at bedtime .  progesterone (PROMETRIUM) 100 MG capsule, Take 100 mg by mouth at bedtime.    Current Outpatient Medications (Analgesics):  .  ibuprofen (ADVIL,MOTRIN) 800 MG tablet, Take by mouth.   Current Outpatient Medications (Other):  .  latanoprost (XALATAN) 0.005 % ophthalmic solution, Place 1 drop into both eyes at bedtime. .  Multiple Vitamins-Minerals (CENTRUM SILVER ULTRA WOMENS PO), Take by mouth daily.   Reviewed prior external information including notes and imaging from  primary care provider As well as notes that were available from care everywhere and other healthcare systems.  Past medical history, social, surgical and family history all reviewed in electronic medical record.  No pertanent information unless stated regarding to the chief complaint.    Review of Systems:  No headache, visual changes, nausea, vomiting, diarrhea, constipation, dizziness, abdominal pain, skin rash, fevers, chills, night sweats, weight loss, swollen lymph nodes, body aches, joint swelling, chest pain, shortness of breath, mood changes. POSITIVE muscle aches  Objective  Blood pressure 114/80, pulse 65, height 5\' 2"  (1.575 m), weight 137 lb (62.1 kg), SpO2 98 %.   General: No apparent distress alert and oriented x3 mood and affect normal, dressed appropriately.  HEENT: Pupils equal, extraocular movements intact  Respiratory: Patient's speak in full sentences and does not appear short of breath  Cardiovascular: No lower extremity edema, non tender, no erythema  Gait normal with good balance and coordination.  MSK: Right knee exam has no significant swelling but still some tenderness to palpation over the medial joint space.  Patient has mild crepitus of the patella noted. No significant instability noted with valgus or varus force.   Impression and Recommendations:     The above documentation has been reviewed and is accurate and complete [name], DO

## 2020-07-13 ENCOUNTER — Ambulatory Visit (INDEPENDENT_AMBULATORY_CARE_PROVIDER_SITE_OTHER): Payer: BC Managed Care – PPO | Admitting: Family Medicine

## 2020-07-13 ENCOUNTER — Other Ambulatory Visit: Payer: Self-pay

## 2020-07-13 ENCOUNTER — Encounter: Payer: Self-pay | Admitting: Family Medicine

## 2020-07-13 DIAGNOSIS — M25561 Pain in right knee: Secondary | ICD-10-CM

## 2020-07-13 DIAGNOSIS — G8929 Other chronic pain: Secondary | ICD-10-CM

## 2020-07-13 NOTE — Patient Instructions (Signed)
Good to see you Gel approval We will call you when we get approval Stay active congrats on the weight loss

## 2020-07-13 NOTE — Assessment & Plan Note (Addendum)
Patient's right knee pain did improve after the injection.  continues to have some pain but is improved from the initial assessment..  He does have moderate narrowing of the medial compartment.  Patient could be candidate for potential viscosupplementation and we will see if we can get approval.  At that time we will consider injection.

## 2020-07-28 ENCOUNTER — Encounter: Payer: Self-pay | Admitting: Family Medicine

## 2020-08-30 NOTE — Telephone Encounter (Signed)
Pt called today to follow up on gel injections, given info from [facility]. Appt made to discuss PRP or continue steroid injection.

## 2020-09-07 NOTE — Progress Notes (Deleted)
[place] Phone: [phone] Subjective:    I'm seeing this patient by the request  of:  [name], MD  CC:   [id number]  07/13/2020 Patient's right knee pain did improve after the injection.  continues to have some pain but is improved from the initial assessment..  He does have moderate narrowing of the medial compartment.  Patient could be candidate for potential viscosupplementation and we will see if we can get approval.  At that time we will consider injection.  Update 09/08/2020 Mercedes [name] is a 60 y.o. female coming in with complaint of R knee pain. Patient states   Onset-  Location Duration-  Character- Aggravating factors- Reliving factors-  Therapies tried-  Severity-     Past Medical History:  Diagnosis Date   Allergy    Anemia    Glaucoma    sees Dr. [name]    Past Surgical History:  Procedure Laterality Date   BREAST BIOPSY Left 2017   benign   COLONOSCOPY  02/25/2018   per Dr. [name], no polyps, repeat in 10 yrs    dental implant  2013   KNEE ARTHROSCOPY  2004   ACL Repair   MOUTH SURGERY     2 weeks ago- has a retainer in, off antibiotics   OOPHORECTOMY  1996   left   Social History   Socioeconomic History   Marital status: Married    Spouse name: Not on file   Number of children: Not on file   Years of education: Not on file   Highest education level: Not on file  Occupational History   Not on file  Tobacco Use   Smoking status: Never   Smokeless tobacco: Never  Substance and Sexual Activity   Alcohol use: Yes    Alcohol/week: 3.0 standard drinks    Types: 3 Standard drinks or equivalent per week    Comment: occ   Drug use: No   Sexual activity: Not on file  Other Topics Concern   Not on file  Social History Narrative   Not on file   Social Determinants of Health   Financial Resource Strain: Not on file  Food Insecurity: Not on file   Transportation Needs: Not on file  Physical Activity: Not on file  Stress: Not on file  Social Connections: Not on file   Allergies  Allergen Reactions   Penicillins     Per pt: allergic as child   Sulfamethoxazole-Trimethoprim Hives   Family History  Problem Relation Age of Onset   Alcohol abuse Other    Asthma Other    Hyperlipidemia Other    Hypertension Other    Heart disease Other    Breast cancer Maternal Aunt    Colon cancer Neg Hx    Stomach cancer Neg Hx    Esophageal cancer Neg Hx    Rectal cancer Neg Hx     Current Outpatient Medications (Endocrine & Metabolic):    Estradiol 1 MG/GM GEL, Place onto the skin. Rub on thigh at bedtime   progesterone (PROMETRIUM) 100 MG capsule, Take 100 mg by mouth at bedtime.    Current Outpatient Medications (Analgesics):    ibuprofen (ADVIL,MOTRIN) 800 MG tablet, Take by mouth.   Current Outpatient Medications (Other):    latanoprost (XALATAN) 0.005 % ophthalmic solution, Place 1 drop into both eyes at bedtime.   Multiple Vitamins-Minerals (CENTRUM SILVER ULTRA WOMENS PO), Take by mouth daily.  Reviewed prior external information including notes and imaging from  primary care provider As well as notes that were available from care everywhere and other healthcare systems.  Past medical history, social, surgical and family history all reviewed in electronic medical record.  No pertanent information unless stated regarding to the chief complaint.   Review of Systems:  No headache, visual changes, nausea, vomiting, diarrhea, constipation, dizziness, abdominal pain, skin rash, fevers, chills, night sweats, weight loss, swollen lymph nodes, body aches, joint swelling, chest pain, shortness of breath, mood changes. POSITIVE muscle aches  Objective  There were no vitals taken for this visit.   General: No apparent distress alert and oriented x3 mood and affect normal, dressed appropriately.  HEENT: Pupils equal, extraocular  movements intact  Respiratory: Patient's speak in full sentences and does not appear short of breath  Cardiovascular: No lower extremity edema, non tender, no erythema  Gait normal with good balance and coordination.  MSK:  Non tender with full range of motion and good stability and symmetric strength and tone of shoulders, elbows, wrist, hip, knee and ankles bilaterally.     Impression and Recommendations:     The above documentation has been reviewed and is accurate and complete Mercedes [name]

## 2020-09-08 ENCOUNTER — Ambulatory Visit: Payer: BC Managed Care – PPO | Admitting: Family Medicine

## 2020-09-27 ENCOUNTER — Other Ambulatory Visit: Payer: Self-pay

## 2020-09-27 ENCOUNTER — Ambulatory Visit (INDEPENDENT_AMBULATORY_CARE_PROVIDER_SITE_OTHER): Payer: BC Managed Care – PPO | Admitting: Podiatry

## 2020-09-27 DIAGNOSIS — L608 Other nail disorders: Secondary | ICD-10-CM | POA: Diagnosis not present

## 2020-09-27 DIAGNOSIS — L603 Nail dystrophy: Secondary | ICD-10-CM | POA: Diagnosis not present

## 2020-09-27 DIAGNOSIS — L814 Other melanin hyperpigmentation: Secondary | ICD-10-CM | POA: Diagnosis not present

## 2020-09-27 DIAGNOSIS — L601 Onycholysis: Secondary | ICD-10-CM | POA: Diagnosis not present

## 2020-09-27 DIAGNOSIS — B351 Tinea unguium: Secondary | ICD-10-CM

## 2020-09-30 NOTE — Progress Notes (Signed)
Subjective:   Patient ID: Ramond Marrow, female   DOB: 60 y.o.   MRN: [id number]   HPI 60 year old female presents the office today with concerns of toenail external right big toe.  She states that she has noticed some yellow discoloration with a dark spot present on the nail.  She has been using over-the-counter Fungi-Nail for about a year without significant improvement.  Patient that she had a pedicure last week and had soreness.  There is no swelling redness or any drainage at this time.  No other concerns.   Review of Systems  All other systems reviewed and are negative.  Past Medical History:  Diagnosis Date   Allergy    Anemia    Glaucoma    sees Dr. [name]     Past Surgical History:  Procedure Laterality Date   BREAST BIOPSY Left 2017   benign   COLONOSCOPY  02/25/2018   per Dr. [name], no polyps, repeat in 10 yrs    dental implant  2013   KNEE ARTHROSCOPY  2004   ACL Repair   MOUTH SURGERY     2 weeks ago- has a retainer in, off antibiotics   OOPHORECTOMY  1996   left     Current Outpatient Medications:    ciprofloxacin-dexamethasone (CIPRODEX) OTIC suspension, 4 drops in affected ear(s) 2 times daily for 7 days., Disp: , Rfl:    Estradiol (DIVIGEL) 0.25 MG/0.25GM GEL, Divigel 1 mg/gram (0.1 %) transdermal gel packet  USE DAILY AS DIRECTED, Disp: , Rfl:    cetirizine (ZYRTEC) 10 MG tablet, Take by mouth., Disp: , Rfl:    Estradiol 1 MG/GM GEL, Place onto the skin. Rub on thigh at bedtime, Disp: , Rfl:    ibuprofen (ADVIL,MOTRIN) 800 MG tablet, Take by mouth., Disp: , Rfl:    latanoprost (XALATAN) 0.005 % ophthalmic solution, Place 1 drop into both eyes at bedtime., Disp: 2.5 mL, Rfl: 12   meloxicam (MOBIC) 15 MG tablet, meloxicam 15 mg tablet  TAKE 1 TABLET BY MOUTH EVERY DAY, Disp: , Rfl:    Multiple Vitamins-Minerals (CENTRUM SILVER ULTRA WOMENS PO), Take by mouth daily., Disp: , Rfl:    progesterone (PROMETRIUM) 100 MG capsule, Take 100 mg by mouth at  bedtime., Disp: , Rfl: 12   progesterone (PROMETRIUM) 100 MG capsule, progesterone micronized 100 mg capsule, Disp: , Rfl:    tretinoin (RETIN-A) 0.025 % cream, tretinoin 0.025 % topical cream, Disp: , Rfl:   Allergies  Allergen Reactions   Penicillins     Per pt: allergic as child   Sulfamethoxazole-Trimethoprim Hives          Objective:  Physical Exam  General: AAO x3, NAD  Dermatological: Right big toenails hypertrophic and dystrophic with yellow-brown discoloration.  Along the central distal portion of the nail with a darkened area which appears to be likely from fungus, drainage.  There is no underlying hyperpigmentation of the nailbed Hyperpigmentation of the surrounding skin.  There is no edema, erythema or signs of infection.  Vascular: Dorsalis Pedis artery and Posterior Tibial artery pedal pulses are 2/4 bilateral with immedate capillary fill time. There is no pain with calf compression, swelling, warmth, erythema.   Neruologic: Grossly intact via light touch bilateral.   Musculoskeletal: No gross boney pedal deformities bilateral. No pain, crepitus, or limitation noted with foot and ankle range of motion bilateral. Muscular strength 5/5 in all groups tested bilateral.  Gait: Unassisted, Nonantalgic.       Assessment:  60 year old female toenail discoloration, likely onychomycosis     Plan:  -Treatment options discussed including all alternatives, risks, and complications -Etiology of symptoms were discussed -Also other treatment options however before doing this elective biopsy right now.  Sharp debridement of any complications of the nail for culture, biopsy.  If needed may need to consider total nail avulsion.  Trula Slade DPM

## 2020-10-04 NOTE — Addendum Note (Signed)
Addended by: [place] Mon R on: 10/04/2020 01:18 PM   Modules accepted: Orders

## 2020-10-11 ENCOUNTER — Encounter: Payer: Self-pay | Admitting: Family Medicine

## 2020-10-11 ENCOUNTER — Ambulatory Visit (INDEPENDENT_AMBULATORY_CARE_PROVIDER_SITE_OTHER): Payer: BC Managed Care – PPO | Admitting: Family Medicine

## 2020-10-11 ENCOUNTER — Other Ambulatory Visit: Payer: Self-pay

## 2020-10-11 DIAGNOSIS — M1711 Unilateral primary osteoarthritis, right knee: Secondary | ICD-10-CM | POA: Diagnosis not present

## 2020-10-11 NOTE — Patient Instructions (Signed)
Good to see you! Steroid injection given today.  Follow-up with me in 6-8 weeks & we'll do PRP.

## 2020-10-11 NOTE — Progress Notes (Signed)
[place] Phone: [phone] Subjective:   Mercedes [name], am serving as a scribe for Dr. Hulan [name].  This visit occurred during the SARS-CoV-2 public health emergency.  Safety protocols were in place, including screening questions prior to the visit, additional usage of staff PPE, and extensive cleaning of exam room while observing appropriate contact time as indicated for disinfecting solutions.   I'm seeing this patient by the request  of:  Mercedes Morale, MD  CC: Right knee pain  [id number]  07/13/2020 Patient's right knee pain did improve after the injection.  continues to have some pain but is improved from the initial assessment..  He does have moderate narrowing of the medial compartment.  Patient could be candidate for potential viscosupplementation and we will see if we can get approval.  At that time we will consider injection.  Update 10/11/2020 Mercedes [name] is a 60 y.o. female coming in with complaint of R knee pain. Patient states that she continues to have pain which is present in posterior aspect. Patient likes to line dance and has been able to dance. Pain worse at night.      Past Medical History:  Diagnosis Date   Allergy    Anemia    Glaucoma    sees Dr. Jola [name]    Past Surgical History:  Procedure Laterality Date   BREAST BIOPSY Left 2017   benign   COLONOSCOPY  02/25/2018   per Dr. Carlean [name], [name] polyps, repeat in 10 yrs    dental implant  2013   KNEE ARTHROSCOPY  2004   ACL Repair   MOUTH SURGERY     2 weeks ago- has a retainer in, off antibiotics   OOPHORECTOMY  1996   left   Social History   Socioeconomic History   Marital status: Married    Spouse name: Not on file   Number of children: Not on file   Years of education: Not on file   Highest education level: Not on file  Occupational History   Not on file  Tobacco Use   Smoking status: Never   Smokeless  tobacco: Never  Substance and Sexual Activity   Alcohol use: Yes    Alcohol/week: 3.0 standard drinks    Types: 3 Standard drinks or equivalent per week    Comment: occ   Drug use: [name]   Sexual activity: Not on file  Other Topics Concern   Not on file  Social History Narrative   Not on file   Social Determinants of Health   Financial Resource Strain: Not on file  Food Insecurity: Not on file  Transportation Needs: Not on file  Physical Activity: Not on file  Stress: Not on file  Social Connections: Not on file   Allergies  Allergen Reactions   Penicillins     Per pt: allergic as child   Sulfamethoxazole-Trimethoprim Hives   Family History  Problem Relation Age of Onset   Alcohol abuse Other    Asthma Other    Hyperlipidemia Other    Hypertension Other    Heart disease Other    Breast cancer Maternal Aunt    Colon cancer Neg Hx    Stomach cancer Neg Hx    Esophageal cancer Neg Hx    Rectal cancer Neg Hx     Current Outpatient Medications (Endocrine & Metabolic):    Estradiol (DIVIGEL) 0.25 MG/0.25GM GEL, Divigel 1 mg/gram (0.1 %) transdermal  gel packet  USE DAILY AS DIRECTED   progesterone (PROMETRIUM) 100 MG capsule, Take 100 mg by mouth at bedtime.   progesterone (PROMETRIUM) 100 MG capsule, progesterone micronized 100 mg capsule   Estradiol 1 MG/GM GEL, Place onto the skin. Rub on thigh at bedtime   Current Outpatient Medications (Respiratory):    cetirizine (ZYRTEC) 10 MG tablet, Take by mouth.  Current Outpatient Medications (Analgesics):    meloxicam (MOBIC) 15 MG tablet, meloxicam 15 mg tablet  TAKE 1 TABLET BY MOUTH EVERY DAY   ibuprofen (ADVIL,MOTRIN) 800 MG tablet, Take by mouth.   Current Outpatient Medications (Other):    latanoprost (XALATAN) 0.005 % ophthalmic solution, Place 1 drop into both eyes at bedtime.   Multiple Vitamins-Minerals (CENTRUM SILVER ULTRA WOMENS PO), Take by mouth daily.   tretinoin (RETIN-A) 0.025 % cream, tretinoin 0.025 %  topical cream   ciprofloxacin-dexamethasone (CIPRODEX) OTIC suspension, 4 drops in affected ear(s) 2 times daily for 7 days.   Reviewed prior external information including notes and imaging from  primary care provider As well as notes that were available from care everywhere and other healthcare systems.  Past medical history, social, surgical and family history all reviewed in electronic medical record.  [name] pertanent information unless stated regarding to the chief complaint.   Review of Systems:  [name] headache, visual changes, nausea, vomiting, diarrhea, constipation, dizziness, abdominal pain, skin rash, fevers, chills, night sweats, weight loss, swollen lymph nodes, body aches, chest pain, shortness of breath, mood changes. POSITIVE muscle aches, joint swelling  Objective  Blood pressure 110/78, pulse 68, height '5\' 2"'$  (1.575 m), weight 141 lb (64 kg), SpO2 98 %.   General: [name] apparent distress alert and oriented x3 mood and affect normal, dressed appropriately.  HEENT: Pupils equal, extraocular movements intact  Respiratory: Patient's speak in full sentences and does not appear short of breath  Cardiovascular: [name] lower extremity edema, non tender, [name] erythema  Gait mild antalgic Right knee exam shows the patient does have some lateral tracking of the patella noted.  Patient does not like the last 10 degrees of flexion.  Patient does have some mild tenderness noted over the medial joint line.  After informed written and verbal consent, patient was seated on exam table. Right knee was prepped with alcohol swab and utilizing anterolateral approach, patient's right knee space was injected with 4:1  marcaine 0.5%: Kenalog '40mg'$ /dL. Patient tolerated the procedure well without immediate complications.   Impression and Recommendations:    The above documentation has been reviewed and is accurate and complete Mercedes Pulley, DO

## 2020-10-11 NOTE — Assessment & Plan Note (Signed)
Known arthritic changes.  Discussed with patient at great length.  May need to consider the possibility of PRP.  Insurance would not cover the viscosupplementation.  Patient will consider this.  Given another steroid injection today though because it has helped her significantly in the past.  Follow-up with me again in 2 months.  We will continue to monitor the weight.

## 2020-10-13 ENCOUNTER — Other Ambulatory Visit: Payer: Self-pay | Admitting: Podiatry

## 2020-10-13 MED ORDER — GENTAMICIN SULFATE 0.1 % EX CREA: 1.0000 "application " | TOPICAL_CREAM | Freq: Three times a day (TID) | CUTANEOUS | 0 refills | Status: DC

## 2020-10-19 ENCOUNTER — Telehealth: Payer: Self-pay | Admitting: *Deleted

## 2020-10-19 NOTE — Telephone Encounter (Signed)
Patient is returning a missed call,please call back.if there is a prescription that needs to be called in , please send to [facility].

## 2020-10-21 DIAGNOSIS — H401131 Primary open-angle glaucoma, bilateral, mild stage: Secondary | ICD-10-CM | POA: Diagnosis not present

## 2020-10-21 DIAGNOSIS — H2513 Age-related nuclear cataract, bilateral: Secondary | ICD-10-CM | POA: Diagnosis not present

## 2020-11-21 ENCOUNTER — Telehealth: Payer: Self-pay | Admitting: Family Medicine

## 2020-11-21 NOTE — Telephone Encounter (Signed)
I would say 2 weeks is enough

## 2020-11-21 NOTE — Telephone Encounter (Signed)
Pt has questions on follow up care/restrictions around PRP ( she is scheduled for this week ).  She walks and does easy line dancing, concerned about giving up her activities for any extended period of time.

## 2020-11-21 NOTE — Telephone Encounter (Signed)
Left message for patient

## 2020-11-21 NOTE — Telephone Encounter (Signed)
Pt decided to cancel until after the holidays.

## 2020-11-22 ENCOUNTER — Ambulatory Visit: Payer: BC Managed Care – PPO | Admitting: Family Medicine

## 2020-11-23 ENCOUNTER — Ambulatory Visit: Payer: BC Managed Care – PPO | Admitting: Family Medicine

## 2021-01-03 ENCOUNTER — Encounter: Payer: Self-pay | Admitting: Podiatry

## 2021-01-11 NOTE — Telephone Encounter (Signed)
Please schedule f/u appointment

## 2021-01-16 ENCOUNTER — Ambulatory Visit: Payer: BC Managed Care – PPO | Admitting: Podiatry

## 2021-03-06 DIAGNOSIS — Z6825 Body mass index (BMI) 25.0-25.9, adult: Secondary | ICD-10-CM | POA: Diagnosis not present

## 2021-03-06 DIAGNOSIS — Z1231 Encounter for screening mammogram for malignant neoplasm of breast: Secondary | ICD-10-CM | POA: Diagnosis not present

## 2021-03-06 DIAGNOSIS — Z13820 Encounter for screening for osteoporosis: Secondary | ICD-10-CM | POA: Diagnosis not present

## 2021-03-06 DIAGNOSIS — Z01419 Encounter for gynecological examination (general) (routine) without abnormal findings: Secondary | ICD-10-CM | POA: Diagnosis not present

## 2021-03-29 DIAGNOSIS — H66001 Acute suppurative otitis media without spontaneous rupture of ear drum, right ear: Secondary | ICD-10-CM | POA: Diagnosis not present

## 2021-05-05 NOTE — Progress Notes (Signed)
?Charlann Boxer D.O. ?[facility] ?[place] ?Phone: [phone] ?Subjective:   ?I, Mercedes [name], am serving as a scribe for Dr. Hulan Saas. ?This visit occurred during the SARS-CoV-2 public health emergency.  Safety protocols were in place, including screening questions prior to the visit, additional usage of staff PPE, and extensive cleaning of exam room while observing appropriate contact time as indicated for disinfecting solutions.  ? ? ?I'm seeing this patient by the request  of:  [name], MD ? ?CC: Left knee pain ? ?[id number]  ?Mercedes [name] is a 61 y.o. female coming in with complaint of L knee pain. Last seen in August 2022 for R knee pain. Patient states that she was running to get a short ball on a diagonal and had to come to sudden stop. Felt knee buckle slightly. After a few days later she was unable to flex knee. ROM has improved. Patient has not played tennis but has been doing water aerobics. Unable to use reciprocal gait when on stairs. Pain over medial aspect. Was just stretching in lobby and felt some pain over lateral aspect.  ?   ? ? ?  ? ?Past Medical History:  ?Diagnosis Date  ? Allergy   ? Anemia   ? Glaucoma   ? sees Dr. [name]   ? ?Past Surgical History:  ?Procedure Laterality Date  ? BREAST BIOPSY Left 2017  ? benign  ? COLONOSCOPY  02/25/2018  ? per Dr. [name], no polyps, repeat in 10 yrs   ? dental implant  2013  ? KNEE ARTHROSCOPY  2004  ? ACL Repair  ? MOUTH SURGERY    ? 2 weeks ago- has a retainer in, off antibiotics  ? OOPHORECTOMY  1996  ? left  ? ?Social History  ? ?Socioeconomic History  ? Marital status: Married  ?  Spouse name: Not on file  ? Number of children: Not on file  ? Years of education: Not on file  ? Highest education level: Not on file  ?Occupational History  ? Not on file  ?Tobacco Use  ? Smoking status: Never  ? Smokeless tobacco: Never  ?Substance and Sexual Activity  ? Alcohol use: Yes  ?   Alcohol/week: 3.0 standard drinks  ?  Types: 3 Standard drinks or equivalent per week  ?  Comment: occ  ? Drug use: No  ? Sexual activity: Not on file  ?Other Topics Concern  ? Not on file  ?Social History Narrative  ? Not on file  ? ?Social Determinants of Health  ? ?Financial Resource Strain: Not on file  ?Food Insecurity: Not on file  ?Transportation Needs: Not on file  ?Physical Activity: Not on file  ?Stress: Not on file  ?Social Connections: Not on file  ? ?Allergies  ?Allergen Reactions  ? Penicillins   ?  Per pt: allergic as child  ? Sulfamethoxazole-Trimethoprim Hives  ? ?Family History  ?Problem Relation Age of Onset  ? Alcohol abuse Other   ? Asthma Other   ? Hyperlipidemia Other   ? Hypertension Other   ? Heart disease Other   ? Breast cancer Maternal Aunt   ? Colon cancer Neg Hx   ? Stomach cancer Neg Hx   ? Esophageal cancer Neg Hx   ? Rectal cancer Neg Hx   ? ? ?Current Outpatient Medications (Endocrine & Metabolic):  ?  Estradiol (DIVIGEL) 0.25 MG/0.25GM GEL, Divigel 1 mg/gram (0.1 %) transdermal gel packet  USE DAILY AS DIRECTED ?  Estradiol 1 MG/GM GEL, Place onto the skin. Rub on thigh at bedtime ?  progesterone (PROMETRIUM) 100 MG capsule, Take 100 mg by mouth at bedtime. ?  progesterone (PROMETRIUM) 100 MG capsule, progesterone micronized 100 mg capsule ? ? ?Current Outpatient Medications (Respiratory):  ?  cetirizine (ZYRTEC) 10 MG tablet, Take by mouth. ? ?Current Outpatient Medications (Analgesics):  ?  ibuprofen (ADVIL,MOTRIN) 800 MG tablet, Take by mouth. ?  meloxicam (MOBIC) 15 MG tablet, meloxicam 15 mg tablet  TAKE 1 TABLET BY MOUTH EVERY DAY ? ? ?Current Outpatient Medications (Other):  ?  ciprofloxacin-dexamethasone (CIPRODEX) OTIC suspension, 4 drops in affected ear(s) 2 times daily for 7 days. ?  gentamicin cream (GARAMYCIN) 0.1 %, Apply 1 application topically 3 (three) times daily. ?  latanoprost (XALATAN) 0.005 % ophthalmic solution, Place 1 drop into both eyes at bedtime. ?   Multiple Vitamins-Minerals (CENTRUM SILVER ULTRA WOMENS PO), Take by mouth daily. ?  tretinoin (RETIN-A) 0.025 % cream, tretinoin 0.025 % topical cream ? ? ?Reviewed prior external information including notes and imaging from  ?primary care provider ?As well as notes that were available from care everywhere and other healthcare systems. ? ?Past medical history, social, surgical and family history all reviewed in electronic medical record.  No pertanent information unless stated regarding to the chief complaint.  ? ?Review of Systems: ? No headache, visual changes, nausea, vomiting, diarrhea, constipation, dizziness, abdominal pain, skin rash, fevers, chills, night sweats, weight loss, swollen lymph nodes, body aches, joint swelling, chest pain, shortness of breath, mood changes. POSITIVE muscle aches ? ?Objective  ?Blood pressure 120/84, pulse 60, height '5\' 2"'$  (1.575 m), weight 144 lb (65.3 kg), SpO2 98 %. ?  ?General: No apparent distress alert and oriented x3 mood and affect normal, dressed appropriately.  ?HEENT: Pupils equal, extraocular movements intact  ?Respiratory: Patient's speak in full sentences and does not appear short of breath  ?Cardiovascular: No lower extremity edema, non tender, no erythema  ?Gait normal with good balance and coordination.  ?MSK: Left knee exam does have swelling noted. ?Patient lacks the last 20 degrees of flexion.  Lacks last 2 degrees of extension.  Positive McMurray's noted.  Nontender to palpation over the medial joint line.  No instability of the knee otherwise. ? ?Limited muscular skeletal ultrasound was performed and interpreted by Hulan Saas, M   ?Limited ultrasound of patient's left knee on the medial aspect shows moderate to severe narrowing of the joint space.  Patient does have what appears to be an acute on chronic meniscal tear noted with displacement of 25%.  Large effusion noted of the patellofemoral joint with moderate narrowing of the joint space as  well. ?Impression: Knee arthritis with medial meniscal tear acute on chronic with effusion ? ?Procedure: Real-time Ultrasound Guided Injection of left knee ?Device: GE Logiq Q7 ?Ultrasound guided injection is preferred based studies that show increased duration, increased effect, greater accuracy, decreased procedural pain, increased response rate, and decreased cost with ultrasound guided versus blind injection.  ?Verbal informed consent obtained.  ?Time-out conducted.  ?Noted no overlying erythema, induration, or other signs of local infection.  ?Skin prepped in a sterile fashion.  ?Local anesthesia: Topical Ethyl chloride.  ?With sterile technique and under real time ultrasound guidance: With a 22-gauge 2 inch needle patient was injected with 4 cc of 0.5% Marcaine and aspirated 50 cc of straw-colored colored fluid and then injected 1 cc of Kenalog 40 mg/dL. This was from a  superior lateral approach.  ?Completed without difficulty  ?Pain immediately resolved suggesting accurate placement of the medication.  ?Advised to call if fevers/chills, erythema, induration, drainage, or persistent bleeding.  ?Impression: Technically successful ultrasound guided injection. ?  ?Impression and Recommendations:  ?  ? ?The above documentation has been reviewed and is accurate and complete [name], DO  ? ? ?

## 2021-05-08 ENCOUNTER — Other Ambulatory Visit: Payer: Self-pay

## 2021-05-08 ENCOUNTER — Ambulatory Visit (INDEPENDENT_AMBULATORY_CARE_PROVIDER_SITE_OTHER): Payer: BC Managed Care – PPO | Admitting: Family Medicine

## 2021-05-08 ENCOUNTER — Ambulatory Visit (INDEPENDENT_AMBULATORY_CARE_PROVIDER_SITE_OTHER): Payer: BC Managed Care – PPO

## 2021-05-08 ENCOUNTER — Encounter: Payer: Self-pay | Admitting: Family Medicine

## 2021-05-08 ENCOUNTER — Ambulatory Visit: Payer: Self-pay

## 2021-05-08 VITALS — BP 120/84 | HR 60 | Ht 62.0 in | Wt 144.0 lb

## 2021-05-08 DIAGNOSIS — M25562 Pain in left knee: Secondary | ICD-10-CM | POA: Diagnosis not present

## 2021-05-08 DIAGNOSIS — S83242A Other tear of medial meniscus, current injury, left knee, initial encounter: Secondary | ICD-10-CM

## 2021-05-08 NOTE — Assessment & Plan Note (Signed)
Injection given secondary to the amount of swelling noted today.  The patient did have significant aspiration done.  We discussed icing regimen and home exercises, discussed which activities to doing which ones to avoid.  Increase activity as tolerated.  Follow-up again in 6 to 8 weeks ?

## 2021-05-08 NOTE — Patient Instructions (Addendum)
Drained knee today ?Exercises ?Avoid twisting motion ?Ice ?Xray ?See me again in 6 weeks ? ?

## 2021-05-09 DIAGNOSIS — D2262 Melanocytic nevi of left upper limb, including shoulder: Secondary | ICD-10-CM | POA: Diagnosis not present

## 2021-05-09 DIAGNOSIS — D692 Other nonthrombocytopenic purpura: Secondary | ICD-10-CM | POA: Diagnosis not present

## 2021-05-09 DIAGNOSIS — L814 Other melanin hyperpigmentation: Secondary | ICD-10-CM | POA: Diagnosis not present

## 2021-05-09 DIAGNOSIS — L821 Other seborrheic keratosis: Secondary | ICD-10-CM | POA: Diagnosis not present

## 2021-05-11 ENCOUNTER — Telehealth: Payer: Self-pay | Admitting: Family Medicine

## 2021-05-11 NOTE — Telephone Encounter (Signed)
Left patient a voicemail and a Therapist, music. ?

## 2021-05-11 NOTE — Telephone Encounter (Signed)
Patient called asking if with her current torn mensicus, should she be looking into seeing an orthopedic surgeon for surgery to repair it? ? ?Please advise. ? ?

## 2021-05-11 NOTE — Telephone Encounter (Signed)
Lets give it time, can get better with time sometimes ?

## 2021-06-02 ENCOUNTER — Ambulatory Visit: Payer: BC Managed Care – PPO | Admitting: Family Medicine

## 2021-06-15 NOTE — Progress Notes (Signed)
?Charlann Boxer D.O. ?[facility] ?[place] ?Phone: [phone] ?Subjective:   ?I, [name], am serving as a scribe for Dr. Hulan Saas. ? ?This visit occurred during the SARS-CoV-2 public health emergency.  Safety protocols were in place, including screening questions prior to the visit, additional usage of staff PPE, and extensive cleaning of exam room while observing appropriate contact time as indicated for disinfecting solutions.  ? ? ?I'm seeing this patient by the request  of:  [name], MD ? ?CC: Bilateral knee pain ? ?[id number]  ?05/08/2021 ?Injection given secondary to the amount of swelling noted today.  The patient did have significant aspiration done.  We discussed icing regimen and home exercises, discussed which activities to doing which ones to avoid.  Increase activity as tolerated.  Follow-up again in 6 to 8 weeks ? ?Updated 06/19/2021 ?AMAIRANI SHUEY is a 61 y.o. female coming in with complaint of bilateral knee pain. F/u for left knee, recently did something to the right knee. L knee started feeling better one week ago and patient might have overdone it she states. Patient stepped sideways and felt pain in R knee over lateral aspect but pain is now in posterior aspect. Patient has been able to do water aerobics since injury. Pain worsens when lying down. Patient has big trip coming up in late June and July also is caring for her 47 yo mother.  ? ? ? ?  ? ?Past Medical History:  ?Diagnosis Date  ? Allergy   ? Anemia   ? Glaucoma   ? sees Dr. [name]   ? ?Past Surgical History:  ?Procedure Laterality Date  ? BREAST BIOPSY Left 2017  ? benign  ? COLONOSCOPY  02/25/2018  ? per Dr. [name], no polyps, repeat in 10 yrs   ? dental implant  2013  ? KNEE ARTHROSCOPY  2004  ? ACL Repair  ? MOUTH SURGERY    ? 2 weeks ago- has a retainer in, off antibiotics  ? OOPHORECTOMY  1996  ? left  ? ?Social History  ? ?Socioeconomic History  ? Marital  status: Married  ?  Spouse name: Not on file  ? Number of children: Not on file  ? Years of education: Not on file  ? Highest education level: Not on file  ?Occupational History  ? Not on file  ?Tobacco Use  ? Smoking status: Never  ? Smokeless tobacco: Never  ?Substance and Sexual Activity  ? Alcohol use: Yes  ?  Alcohol/week: 3.0 standard drinks  ?  Types: 3 Standard drinks or equivalent per week  ?  Comment: occ  ? Drug use: No  ? Sexual activity: Not on file  ?Other Topics Concern  ? Not on file  ?Social History Narrative  ? Not on file  ? ?Social Determinants of Health  ? ?Financial Resource Strain: Not on file  ?Food Insecurity: Not on file  ?Transportation Needs: Not on file  ?Physical Activity: Not on file  ?Stress: Not on file  ?Social Connections: Not on file  ? ?Allergies  ?Allergen Reactions  ? Penicillins   ?  Per pt: allergic as child  ? Sulfamethoxazole-Trimethoprim Hives  ? ?Family History  ?Problem Relation Age of Onset  ? Alcohol abuse Other   ? Asthma Other   ? Hyperlipidemia Other   ? Hypertension Other   ? Heart disease Other   ? Breast cancer Maternal Aunt   ? Colon cancer Neg Hx   ?  Stomach cancer Neg Hx   ? Esophageal cancer Neg Hx   ? Rectal cancer Neg Hx   ? ? ?Current Outpatient Medications (Endocrine & Metabolic):  ?  Estradiol (DIVIGEL) 0.25 MG/0.25GM GEL, Divigel 1 mg/gram (0.1 %) transdermal gel packet  USE DAILY AS DIRECTED ?  Estradiol 1 MG/GM GEL, Place onto the skin. Rub on thigh at bedtime ?  progesterone (PROMETRIUM) 100 MG capsule, Take 100 mg by mouth at bedtime. ?  progesterone (PROMETRIUM) 100 MG capsule, progesterone micronized 100 mg capsule ? ? ?Current Outpatient Medications (Respiratory):  ?  cetirizine (ZYRTEC) 10 MG tablet, Take by mouth. ? ?Current Outpatient Medications (Analgesics):  ?  ibuprofen (ADVIL,MOTRIN) 800 MG tablet, Take by mouth. ?  meloxicam (MOBIC) 15 MG tablet, meloxicam 15 mg tablet  TAKE 1 TABLET BY MOUTH EVERY DAY ? ? ?Current Outpatient Medications  (Other):  ?  ciprofloxacin-dexamethasone (CIPRODEX) OTIC suspension, 4 drops in affected ear(s) 2 times daily for 7 days. ?  gentamicin cream (GARAMYCIN) 0.1 %, Apply 1 application topically 3 (three) times daily. ?  latanoprost (XALATAN) 0.005 % ophthalmic solution, Place 1 drop into both eyes at bedtime. ?  Multiple Vitamins-Minerals (CENTRUM SILVER ULTRA WOMENS PO), Take by mouth daily. ?  tretinoin (RETIN-A) 0.025 % cream, tretinoin 0.025 % topical cream ? ? ?Reviewed prior external information including notes and imaging from  ?primary care provider ?As well as notes that were available from care everywhere and other healthcare systems. ? ?Past medical history, social, surgical and family history all reviewed in electronic medical record.  No pertanent information unless stated regarding to the chief complaint.  ? ?Review of Systems: ? No headache, visual changes, nausea, vomiting, diarrhea, constipation, dizziness, abdominal pain, skin rash, fevers, chills, night sweats, weight loss, swollen lymph nodes, body aches, joint swelling, chest pain, shortness of breath, mood changes. POSITIVE muscle aches ? ?Objective  ?Blood pressure 110/80, pulse 75, height '5\' 2"'$  (1.575 m), weight 148 lb (67.1 kg), SpO2 97 %. ?  ?General: No apparent distress alert and oriented x3 mood and affect normal, dressed appropriately.  ?HEENT: Pupils equal, extraocular movements intact  ?Respiratory: Patient's speak in full sentences and does not appear short of breath  ?Cardiovascular: No lower extremity edema, non tender, no erythema  ?Gait normal with good balance and coordination.  ?MSK: Bilateral knee exam shows the patient does have trace effusion noted of the right knee ?Tender to palpation over the medial joint line no significant instability noted positive grind test noted ? ?Procedure: Real-time Ultrasound Guided Injection of right knee ?Device: GE Logiq Q7 ?Ultrasound guided injection is preferred based studies that show  increased duration, increased effect, greater accuracy, decreased procedural pain, increased response rate, and decreased cost with ultrasound guided versus blind injection.  ?Verbal informed consent obtained.  ?Time-out conducted.  ?Noted no overlying erythema, induration, or other signs of local infection.  ?Skin prepped in a sterile fashion.  ?Local anesthesia: Topical Ethyl chloride.  ?With sterile technique and under real time ultrasound guidance: With a 22-gauge 2 inch needle patient was injected with 4 cc of 0.5% Marcaine and aspirated 65 cc of straw light-colored fluid then injected 1 cc of Kenalog 40 mg/dL. This was from a superior lateral approach.  ?Completed without difficulty  ?Pain immediately resolved suggesting accurate placement of the medication.  ?Advised to call if fevers/chills, erythema, induration, drainage, or persistent bleeding.  ?Impression: Technically successful ultrasound guided injection. ? ? ?  ?Impression and Recommendations:  ?  ? ?The above  documentation has been reviewed and is accurate and complete [name], DO ? ? ?

## 2021-06-19 ENCOUNTER — Ambulatory Visit (INDEPENDENT_AMBULATORY_CARE_PROVIDER_SITE_OTHER): Payer: BC Managed Care – PPO | Admitting: Family Medicine

## 2021-06-19 ENCOUNTER — Encounter: Payer: Self-pay | Admitting: Family Medicine

## 2021-06-19 ENCOUNTER — Ambulatory Visit: Payer: Self-pay

## 2021-06-19 ENCOUNTER — Ambulatory Visit (INDEPENDENT_AMBULATORY_CARE_PROVIDER_SITE_OTHER): Payer: BC Managed Care – PPO

## 2021-06-19 VITALS — BP 110/80 | HR 75 | Ht 62.0 in | Wt 148.0 lb

## 2021-06-19 DIAGNOSIS — M25561 Pain in right knee: Secondary | ICD-10-CM | POA: Diagnosis not present

## 2021-06-19 DIAGNOSIS — M1711 Unilateral primary osteoarthritis, right knee: Secondary | ICD-10-CM

## 2021-06-19 DIAGNOSIS — M25562 Pain in left knee: Secondary | ICD-10-CM | POA: Diagnosis not present

## 2021-06-19 NOTE — Patient Instructions (Addendum)
Drained R knee today ?Xray R knee ?Will get approval for gel ?See me in 2 months ? ?

## 2021-06-19 NOTE — Assessment & Plan Note (Signed)
Arthritis noted in his knee.  He continues to be aggressive with the therapy at this time.  Patient could be a candidate for viscosupplementation and we will see if we can get approval.  Encourage patient to continue to stay active but more of the nonweightbearing cardiovascular exercises if possible.  Follow-up with me again 6 to 8 weeks ?

## 2021-06-19 NOTE — Assessment & Plan Note (Signed)
Patient does have patellofemoral arthritis with a significant amount of effusion.  It did drain the knee and given injection.  Hopeful that this will make significant improvement.  Patient is still working on the lateral meniscal tear noted on the contralateral side.  We will continue to monitor patient's symptoms.  Follow-up with me again in 6 to 8 weeks otherwise. ?

## 2021-06-20 ENCOUNTER — Encounter: Payer: Self-pay | Admitting: Family Medicine

## 2021-06-20 LAB — SYNOVIAL FLUID ANALYSIS, COMPLETE
Basophils, %: 0 %
Eosinophils-Synovial: 0 % (ref 0–2)
Lymphocytes-Synovial Fld: 29 % (ref 0–74)
Monocyte/Macrophage: 51 % (ref 0–69)
Neutrophil, Synovial: 20 % (ref 0–24)
Synoviocytes, %: 0 % (ref 0–15)
WBC, Synovial: 342 cells/uL — ABNORMAL HIGH (ref <150)

## 2021-06-21 ENCOUNTER — Ambulatory Visit: Payer: BC Managed Care – PPO | Admitting: Family Medicine

## 2021-06-28 ENCOUNTER — Ambulatory Visit: Payer: Self-pay

## 2021-06-28 ENCOUNTER — Ambulatory Visit (INDEPENDENT_AMBULATORY_CARE_PROVIDER_SITE_OTHER): Payer: BC Managed Care – PPO | Admitting: Sports Medicine

## 2021-06-28 VITALS — BP 110/80 | HR 75 | Ht 62.0 in | Wt 148.0 lb

## 2021-06-28 DIAGNOSIS — M1711 Unilateral primary osteoarthritis, right knee: Secondary | ICD-10-CM | POA: Diagnosis not present

## 2021-06-28 DIAGNOSIS — M25561 Pain in right knee: Secondary | ICD-10-CM

## 2021-06-28 MED ORDER — CELECOXIB 200 MG PO CAPS: 200.0000 mg | ORAL_CAPSULE | Freq: Two times a day (BID) | ORAL | 0 refills | Status: DC

## 2021-06-28 NOTE — Patient Instructions (Addendum)
Good to see you  ?HA injection authorization  ?Celebrex 200 2 x a day take for 1 week until you see Dr. Tamala Julian  ?Follow up with Dr. Tamala Julian next week  ? ?

## 2021-06-28 NOTE — Progress Notes (Signed)
? ? Benito Mccreedy D.Merril Abbe ?[facility] ?[place] ?Phone: [phone] ?  ?Assessment and Plan:   ?  ?1. Right knee pain, unspecified chronicity ?-Chronic with exacerbation, subsequent visit ?- Recurrence of right knee swelling and pain likely presenting flare of osteoarthritis based on HPI, physical exam, clear/straw-colored fluid on aspiration ?- Patient was feeling improved after aspiration and CSI for right knee with Dr. Tamala Julian on 06/19/2021, so she increased her physical activity, and had recurrence of right knee pain and swelling ?- Patient elected for aspiration at today's visit.  As patient had CSI on visit on 06/19/2021, CSI was not repeated at today's visit. ?- Patient is interested in HA injection, and I think this is appropriate for her although it is a close timeframe to her CSI.  Patient's insurance requires prior authorization, so we will order HA at today's visit, and patient has follow-up visit scheduled with Dr. Tamala Julian for next week on 07/04/2021.  Recommend keeping this visit and can discuss HA injection at that time ?- Due to continued pain, will start Celebrex 200 mg twice daily for the next 1 week until reevaluated ? ?Procedure: Ultrasound Guided Knee Joint Injection/Aspiration ?Side: Right ?Diagnosis: Right knee osteoarthritis with acute swelling and pain ?[place] Indication:  ?- accuracy is paramount for diagnosis ?- to ensure therapeutic efficacy or procedural success ?- to reduce procedural risk ? ?Risks explained and consent was given verbally. The site was cleaned with Chlorhexidine. The suprapatellar pouch of the knee was identified.  A superficial wheal and numbing track was made using 25-gauge needle and 1 mL 1% lidocaine.  An 18-gauge needle was introduced to the pouch under ultrasound guidance.  97 mL of  clear/straw colored synovial fluid was aspirated.   This was well tolerated and resulted in symptomatic relief.  Needle was removed,  hemostasis achieved, and post injection instructions were explained.   Pt was advised to call or return to clinic if these symptoms worsen or fail to improve as anticipated.  ?  ?Pertinent previous records reviewed include none ?  ?Follow Up: - Patient is interested in HA injection, and I think this is appropriate for her although it is a close timeframe to her CSI.  Patient's insurance requires prior authorization, so we will order HA at today's visit, and patient has follow-up visit scheduled with Dr. Tamala Julian for next week on 07/04/2021.  Recommend keeping this visit and can discuss HA injection at that time  ? ?  ?Subjective:   ?I, Pincus Badder, am serving as a Education administrator for [employer] ? ?Chief Complaint: right knee pain  ? ?HPI:  ?  ?05/08/2021 ?Injection given secondary to the amount of swelling noted today.  The patient did have significant aspiration done.  We discussed icing regimen and home exercises, discussed which activities to doing which ones to avoid.  Increase activity as tolerated.  Follow-up again in 6 to 8 weeks ?  ?Updated 06/19/2021 ?Mercedes [name] is a 61 y.o. female coming in with complaint of bilateral knee pain. F/u for left knee, recently did something to the right knee. L knee started feeling better one week ago and patient might have overdone it she states. Patient stepped sideways and felt pain in R knee over lateral aspect but pain is now in posterior aspect. Patient has been able to do water aerobics since injury. Pain worsens when lying down. Patient has big trip coming up in late June and July also is caring  for her 70 yo mother.  ? ?06/28/21 ?Patient states that within a couple of day of last visit knee is swollen and achy, isn't able to sleep , states her knee is twice the size of her normal , wants to know what she can take besides Advil , has tried meloxicam and that does not work for her, would like to get rid of the fluid on her knee  ? ?Relevant Historical  Information: None pertinent ? ?Additional pertinent review of systems negative. ? ? ?Current Outpatient Medications:  ?  celecoxib (CELEBREX) 200 MG capsule, Take 1 capsule (200 mg total) by mouth 2 (two) times daily., Disp: 14 capsule, Rfl: 0 ?  cetirizine (ZYRTEC) 10 MG tablet, Take by mouth., Disp: , Rfl:  ?  Estradiol (DIVIGEL) 0.25 MG/0.25GM GEL, Divigel 1 mg/gram (0.1 %) transdermal gel packet  USE DAILY AS DIRECTED, Disp: , Rfl:  ?  latanoprost (XALATAN) 0.005 % ophthalmic solution, Place 1 drop into both eyes at bedtime., Disp: 2.5 mL, Rfl: 12 ?  Multiple Vitamins-Minerals (CENTRUM SILVER ULTRA WOMENS PO), Take by mouth daily., Disp: , Rfl:  ?  progesterone (PROMETRIUM) 100 MG capsule, Take 100 mg by mouth at bedtime., Disp: , Rfl: 12 ?  progesterone (PROMETRIUM) 100 MG capsule, progesterone micronized 100 mg capsule, Disp: , Rfl:  ?  Estradiol 1 MG/GM GEL, Place onto the skin. Rub on thigh at bedtime, Disp: , Rfl:  ?  gentamicin cream (GARAMYCIN) 0.1 %, Apply 1 application topically 3 (three) times daily., Disp: 15 g, Rfl: 0 ?  ibuprofen (ADVIL,MOTRIN) 800 MG tablet, Take by mouth., Disp: , Rfl:  ?  meloxicam (MOBIC) 15 MG tablet, meloxicam 15 mg tablet  TAKE 1 TABLET BY MOUTH EVERY DAY, Disp: , Rfl:  ?  tretinoin (RETIN-A) 0.025 % cream, tretinoin 0.025 % topical cream, Disp: , Rfl:   ? ?Objective:   ?  ?Vitals:  ? 06/28/21 1459  ?BP: 110/80  ?Pulse: 75  ?SpO2: 97%  ?Weight: 148 lb (67.1 kg)  ?Height: '5\' 2"'$  (1.575 m)  ?  ?  ?Body mass index is 27.07 kg/m?.  ?  ?Physical Exam:   ? ?General:  awake, alert oriented, no acute distress nontoxic ?Skin: no suspicious lesions or rashes ?Neuro:sensation intact, no deficits, strength 5/5 with no deficits, no atrophy, normal muscle tone ?Psych: No signs of anxiety, depression or other mood disorder ? ?Knee: ?Large swelling ?No deformity ?Positive fluid wave, joint milking ?ROM Flex 80, Ext 15 ?  ? ?Gait abnormal, favoring left leg ? ? ?Electronically signed by:  ?Benito Mccreedy D.Merril Abbe ?[facility] ?3:46 PM 06/28/21 ?

## 2021-06-29 ENCOUNTER — Telehealth: Payer: Self-pay | Admitting: Sports Medicine

## 2021-06-29 ENCOUNTER — Other Ambulatory Visit: Payer: Self-pay | Admitting: Sports Medicine

## 2021-06-29 DIAGNOSIS — M25561 Pain in right knee: Secondary | ICD-10-CM

## 2021-06-29 DIAGNOSIS — M25461 Effusion, right knee: Secondary | ICD-10-CM

## 2021-06-29 DIAGNOSIS — M25361 Other instability, right knee: Secondary | ICD-10-CM

## 2021-06-29 NOTE — Telephone Encounter (Signed)
Patient called asking if Dr Glennon Mac would be willing to order an MRI of her knee. She said that she is concerned and wanted to make sure nothing else is going on.  Please advise.  Would like it ordered through [facility].

## 2021-06-29 NOTE — Progress Notes (Signed)
MRI without contrast due to right knee pain, right knee effusion, right knee instability.

## 2021-06-29 NOTE — Telephone Encounter (Signed)
Pt was called and left a VM notifying MRI has been put into [facility] imaging

## 2021-07-03 ENCOUNTER — Telehealth: Payer: Self-pay

## 2021-07-03 NOTE — Progress Notes (Deleted)
[place] Phone: [phone] Subjective:    I'm seeing this patient by the request  of:  [name], MD  CC:   [id number]  06/19/2021 Patient does have patellofemoral arthritis with a significant amount of effusion.  It did drain the knee and given injection.  Hopeful that this will make significant improvement.  Patient is still working on the lateral meniscal tear noted on the contralateral side.  We will continue to monitor patient's symptoms.  Follow-up with me again in 6 to 8 weeks otherwise.  Update 07/04/2021 Mercedes [name] is a 61 y.o. female coming in with complaint of R knee pain. Saw Dr. Glennon Mac on 06/28/2021. Patient states       Past Medical History:  Diagnosis Date   Allergy    Anemia    Glaucoma    sees Dr. [name]    Past Surgical History:  Procedure Laterality Date   BREAST BIOPSY Left 2017   benign   COLONOSCOPY  02/25/2018   per Dr. [name], no polyps, repeat in 10 yrs    dental implant  2013   KNEE ARTHROSCOPY  2004   ACL Repair   MOUTH SURGERY     2 weeks ago- has a retainer in, off antibiotics   OOPHORECTOMY  1996   left   Social History   Socioeconomic History   Marital status: Married    Spouse name: Not on file   Number of children: Not on file   Years of education: Not on file   Highest education level: Not on file  Occupational History   Not on file  Tobacco Use   Smoking status: Never   Smokeless tobacco: Never  Substance and Sexual Activity   Alcohol use: Yes    Alcohol/week: 3.0 standard drinks    Types: 3 Standard drinks or equivalent per week    Comment: occ   Drug use: No   Sexual activity: Not on file  Other Topics Concern   Not on file  Social History Narrative   Not on file   Social Determinants of Health   Financial Resource Strain: Not on file  Food Insecurity: Not on file  Transportation Needs: Not on file  Physical Activity:  Not on file  Stress: Not on file  Social Connections: Not on file   Allergies  Allergen Reactions   Penicillins     Per pt: allergic as child   Sulfamethoxazole-Trimethoprim Hives   Family History  Problem Relation Age of Onset   Alcohol abuse Other    Asthma Other    Hyperlipidemia Other    Hypertension Other    Heart disease Other    Breast cancer Maternal Aunt    Colon cancer Neg Hx    Stomach cancer Neg Hx    Esophageal cancer Neg Hx    Rectal cancer Neg Hx     Current Outpatient Medications (Endocrine & Metabolic):    Estradiol (DIVIGEL) 0.25 MG/0.25GM GEL, Divigel 1 mg/gram (0.1 %) transdermal gel packet  USE DAILY AS DIRECTED   Estradiol 1 MG/GM GEL, Place onto the skin. Rub on thigh at bedtime   progesterone (PROMETRIUM) 100 MG capsule, Take 100 mg by mouth at bedtime.   progesterone (PROMETRIUM) 100 MG capsule, progesterone micronized 100 mg capsule   Current Outpatient Medications (Respiratory):    cetirizine (ZYRTEC) 10 MG tablet, Take by mouth.  Current Outpatient Medications (Analgesics):    celecoxib (CELEBREX)  200 MG capsule, Take 1 capsule (200 mg total) by mouth 2 (two) times daily.   ibuprofen (ADVIL,MOTRIN) 800 MG tablet, Take by mouth.   meloxicam (MOBIC) 15 MG tablet, meloxicam 15 mg tablet  TAKE 1 TABLET BY MOUTH EVERY DAY   Current Outpatient Medications (Other):    gentamicin cream (GARAMYCIN) 0.1 %, Apply 1 application topically 3 (three) times daily.   latanoprost (XALATAN) 0.005 % ophthalmic solution, Place 1 drop into both eyes at bedtime.   Multiple Vitamins-Minerals (CENTRUM SILVER ULTRA WOMENS PO), Take by mouth daily.   tretinoin (RETIN-A) 0.025 % cream, tretinoin 0.025 % topical cream   Reviewed prior external information including notes and imaging from  primary care provider As well as notes that were available from care everywhere and other healthcare systems.  Past medical history, social, surgical and family history all reviewed  in electronic medical record.  No pertanent information unless stated regarding to the chief complaint.   Review of Systems:  No headache, visual changes, nausea, vomiting, diarrhea, constipation, dizziness, abdominal pain, skin rash, fevers, chills, night sweats, weight loss, swollen lymph nodes, body aches, joint swelling, chest pain, shortness of breath, mood changes. POSITIVE muscle aches  Objective  There were no vitals taken for this visit.   General: No apparent distress alert and oriented x3 mood and affect normal, dressed appropriately.  HEENT: Pupils equal, extraocular movements intact  Respiratory: Patient's speak in full sentences and does not appear short of breath  Cardiovascular: No lower extremity edema, non tender, no erythema  Gait normal with good balance and coordination.  MSK:  Non tender with full range of motion and good stability and symmetric strength and tone of shoulders, elbows, wrist, hip, knee and ankles bilaterally.     Impression and Recommendations:     The above documentation has been reviewed and is accurate and complete [name]

## 2021-07-04 ENCOUNTER — Other Ambulatory Visit: Payer: Self-pay

## 2021-07-04 ENCOUNTER — Ambulatory Visit: Payer: BC Managed Care – PPO | Admitting: Family Medicine

## 2021-07-04 ENCOUNTER — Other Ambulatory Visit: Payer: Self-pay | Admitting: Sports Medicine

## 2021-07-04 MED ORDER — CELECOXIB 200 MG PO CAPS: 200.0000 mg | ORAL_CAPSULE | Freq: Two times a day (BID) | ORAL | 0 refills | Status: DC

## 2021-07-04 NOTE — Telephone Encounter (Signed)
Rx refilled for patient.

## 2021-07-04 NOTE — Telephone Encounter (Signed)
Patient called following up. She said she only has enough for today.

## 2021-07-06 MED ORDER — TIZANIDINE HCL 4 MG PO TABS: 4.0000 mg | ORAL_TABLET | Freq: Every evening | ORAL | 0 refills | Status: DC

## 2021-07-09 ENCOUNTER — Ambulatory Visit
Admission: RE | Admit: 2021-07-09 | Discharge: 2021-07-09 | Disposition: A | Discharge status: Home / Self Care | Payer: BC Managed Care – PPO | Source: Ambulatory Visit | Attending: Sports Medicine | Admitting: Sports Medicine

## 2021-07-09 DIAGNOSIS — M25461 Effusion, right knee: Secondary | ICD-10-CM

## 2021-07-09 DIAGNOSIS — M25361 Other instability, right knee: Secondary | ICD-10-CM

## 2021-07-09 DIAGNOSIS — M25561 Pain in right knee: Secondary | ICD-10-CM

## 2021-07-11 ENCOUNTER — Telehealth: Payer: Self-pay | Admitting: Family Medicine

## 2021-07-11 ENCOUNTER — Ambulatory Visit: Payer: BC Managed Care – PPO | Admitting: Family Medicine

## 2021-07-11 NOTE — Telephone Encounter (Signed)
Pt called about MRI results knee, I read her Dr. Marisue Brooklyn response.  Would like Dr. Tamala Julian to review and provide his input. Pt is also currently in significant pain and would like his advise/help with that as well.

## 2021-07-17 ENCOUNTER — Ambulatory Visit (INDEPENDENT_AMBULATORY_CARE_PROVIDER_SITE_OTHER): Payer: BC Managed Care – PPO | Admitting: Family Medicine

## 2021-07-17 ENCOUNTER — Encounter: Payer: Self-pay | Admitting: Family Medicine

## 2021-07-17 VITALS — BP 128/76 | HR 71 | Temp 98.5°F | Wt 146.0 lb

## 2021-07-17 DIAGNOSIS — H401131 Primary open-angle glaucoma, bilateral, mild stage: Secondary | ICD-10-CM | POA: Diagnosis not present

## 2021-07-17 DIAGNOSIS — G4700 Insomnia, unspecified: Secondary | ICD-10-CM

## 2021-07-17 MED ORDER — DICLOFENAC SODIUM 1 % EX GEL: 4.0000 g | Freq: Four times a day (QID) | CUTANEOUS | 5 refills | Status: DC

## 2021-07-17 MED ORDER — ZOLPIDEM TARTRATE 10 MG PO TABS: 10.0000 mg | ORAL_TABLET | Freq: Every evening | ORAL | 0 refills | Status: DC | PRN

## 2021-07-17 NOTE — Progress Notes (Signed)
   Subjective:    Patient ID: Mercedes [name], female    DOB: 1960/06/20, 61 y.o.   MRN: [id number]  HPI Here asking for a sleep medication that she can use during some upcoming travelling. Part of this will be flying and she usually cannot sleep on a plane.    Review of Systems  Constitutional: Negative.   Respiratory: Negative.    Cardiovascular: Negative.   Psychiatric/Behavioral:  Positive for sleep disturbance. Negative for agitation and dysphoric mood. The patient is not nervous/anxious.       Objective:   Physical Exam Constitutional:      Appearance: Normal appearance.  Cardiovascular:     Rate and Rhythm: Normal rate and regular rhythm.     Pulses: Normal pulses.     Heart sounds: Normal heart sounds.  Pulmonary:     Effort: Pulmonary effort is normal.     Breath sounds: Normal breath sounds.  Neurological:     Mental Status: She is alert.          Assessment & Plan:  Insomnia, she will try taking Zolpidem 10 mg at bedtime as needed.  Alysia Penna, MD

## 2021-07-17 NOTE — Progress Notes (Unsigned)
[place] Phone: [phone] Subjective:   Mercedes [name], am serving as a scribe for Dr. Hulan [name].   I'm seeing this patient by the request  of:  Mercedes Morale, MD  CC: Right knee pain  [id number]  Mercedes [name] is a 61 y.o. female coming in with complaint of R knee pain. Patient states that continuing to have pain.  Starting with the more pressure on the contralateral knee.  We have seen the patient with the right knee previously as well.  MRI of the knee showed moderate to severe medial arthritic changes of the knee with a degenerative tear.  Patient also has moderate to severe patellofemoral arthritis with synovitis.    Past Medical History:  Diagnosis Date   Allergy    Anemia    Glaucoma    sees Dr. Jola [name]    Past Surgical History:  Procedure Laterality Date   BREAST BIOPSY Left 2017   benign   COLONOSCOPY  02/25/2018   per Mercedes [name], [name] polyps, repeat in 10 yrs    dental implant  2013   KNEE ARTHROSCOPY  2004   ACL Repair   MOUTH SURGERY     2 weeks ago- has a retainer in, off antibiotics   OOPHORECTOMY  1996   left   Social History   Socioeconomic History   Marital status: Married    Spouse name: Not on file   Number of children: Not on file   Years of education: Not on file   Highest education level: Bachelor's degree (e.g., BA, AB, BS)  Occupational History   Not on file  Tobacco Use   Smoking status: Never   Smokeless tobacco: Never  Substance and Sexual Activity   Alcohol use: Yes    Alcohol/week: 3.0 standard drinks    Types: 3 Standard drinks or equivalent per week    Comment: occ   Drug use: [name]   Sexual activity: Not on file  Other Topics Concern   Not on file  Social History Narrative   Not on file   Social Determinants of Health   Financial Resource Strain: Low Risk    Difficulty of Paying Living Expenses: Not hard at all  Food Insecurity:  [name] Food Insecurity   Worried About Charity fundraiser in the Last Year: Never true   [place] in the Last Year: Never true  Transportation Needs: [name] Transportation Needs   Lack of Transportation (Medical): [name]   Lack of Transportation (Non-Medical): [name]  Physical Activity: Sufficiently Active   Days of Exercise per Week: 5 days   Minutes of Exercise per Session: 90 min  Stress: [name] Stress Concern Present   Feeling of Stress : Not at all  Social Connections: Socially Integrated   Frequency of Communication with Friends and Family: More than three times a week   Frequency of Social Gatherings with Friends and Family: More than three times a week   Attends Religious Services: More than 4 times per year   Active Member of [employer] or Organizations: Yes   Attends Archivist Meetings: Not on file   Marital Status: Married   Allergies  Allergen Reactions   Penicillins     Per pt: allergic as child   Sulfamethoxazole-Trimethoprim Hives   Family History  Problem Relation Age of Onset   Alcohol abuse Other    Asthma Other    Hyperlipidemia Other  Hypertension Other    Heart disease Other    Breast cancer Maternal Aunt    Colon cancer Neg Hx    Stomach cancer Neg Hx    Esophageal cancer Neg Hx    Rectal cancer Neg Hx     Current Outpatient Medications (Endocrine & Metabolic):    Estradiol (DIVIGEL) 0.25 MG/0.25GM GEL, Divigel 1 mg/gram (0.1 %) transdermal gel packet  USE DAILY AS DIRECTED   progesterone (PROMETRIUM) 100 MG capsule, Take 100 mg by mouth at bedtime.   progesterone (PROMETRIUM) 100 MG capsule, progesterone micronized 100 mg capsule   Current Outpatient Medications (Respiratory):    cetirizine (ZYRTEC) 10 MG tablet, Take by mouth.    Current Outpatient Medications (Other):    diclofenac Sodium (VOLTAREN ARTHRITIS PAIN) 1 % GEL, Apply 4 g topically 4 (four) times daily.   latanoprost (XALATAN) 0.005 % ophthalmic solution, Place 1 drop into both eyes  at bedtime.   Multiple Vitamins-Minerals (CENTRUM SILVER ULTRA WOMENS PO), Take by mouth daily.   tiZANidine (ZANAFLEX) 4 MG tablet, Take 1 tablet (4 mg total) by mouth Nightly.   zolpidem (AMBIEN) 10 MG tablet, Take 1 tablet (10 mg total) by mouth at bedtime as needed for sleep.   hes, joint swelling, chest pain, shortness of breath, mood changes. POSITIVE muscle aches  Objective  Blood pressure 110/80, pulse 64, height '5\' 2"'$  (1.575 m), weight 146 lb (66.2 kg), SpO2 98 %.   General: [name] apparent distress alert and oriented x3 mood and affect normal, dressed appropriately.   After informed written and verbal consent, patient was seated on exam table. Right knee was prepped with alcohol swab and utilizing anterolateral approach, patient's right knee space was injected with 2 cc of 0.5% Marcaine and then injected with 5 cc of PRP with a 21-gauge 2 inch needle. Patient tolerated the procedure well without immediate complications.   Impression and Recommendations:     The above documentation has been reviewed and is accurate and complete Mercedes Pulley, DO

## 2021-07-18 ENCOUNTER — Telehealth: Payer: Self-pay | Admitting: Family Medicine

## 2021-07-18 ENCOUNTER — Telehealth: Payer: Self-pay

## 2021-07-18 ENCOUNTER — Ambulatory Visit: Payer: Self-pay | Admitting: Family Medicine

## 2021-07-18 ENCOUNTER — Encounter: Payer: Self-pay | Admitting: Family Medicine

## 2021-07-18 DIAGNOSIS — M1711 Unilateral primary osteoarthritis, right knee: Secondary | ICD-10-CM

## 2021-07-18 NOTE — Telephone Encounter (Signed)
Pt requesting info on recommendation for a rheumatologist in the area

## 2021-07-18 NOTE — Telephone Encounter (Signed)
I like [facility]

## 2021-07-18 NOTE — Telephone Encounter (Signed)
Spoke with patient message given.

## 2021-07-18 NOTE — Assessment & Plan Note (Signed)
Patient's MRI does show fairly significant arthritic changes.  PRP given today.  On potential side effects.  Post PRP instructions given.  Patient will be traveling for trip here soon and hopefully this will make some improvement.  Will need replacement at some point likely in the near future.

## 2021-07-18 NOTE — Telephone Encounter (Signed)
Please advise 

## 2021-07-18 NOTE — Telephone Encounter (Signed)
Pt PA for diclofenac gel was sent to plan Message Your information has been submitted to [place]. To check for an updated outcome later, reopen this PA request from your dashboard.  If [employer] has not responded to your request within 24 hours, contact [place] at [phone]. If you think there may be a problem with your PA request, use our live chat feature at the bottom right.

## 2021-07-18 NOTE — Patient Instructions (Addendum)
No ice or IBU for 3 days Heat and Tylenol is ok See me at your next appt in July

## 2021-07-25 ENCOUNTER — Telehealth: Payer: Self-pay | Admitting: Orthopaedic Surgery

## 2021-07-25 NOTE — Telephone Encounter (Signed)
Called patient left message to return call to schedule an appointment with Dr. Durward Fortes per [facility] request       MRI    Right knee

## 2021-07-26 ENCOUNTER — Ambulatory Visit: Payer: BC Managed Care – PPO | Admitting: Podiatry

## 2021-07-26 NOTE — Progress Notes (Signed)
Mercedes [name] [place] Phone: [phone] Subjective:   [name], am serving as a scribe for Dr. Hulan Saas. I'm seeing this patient by the request  of:  [name], MD  CC: right ankle pain   MPN:[id number]  07/18/2021 Patient's MRI does show fairly significant arthritic changes.  PRP given today.  On potential side effects.  Post PRP instructions given.  Patient will be traveling for trip here soon and hopefully this will make some improvement.  Will need replacement at some point likely in the near future.  Update 07/27/2021 Mercedes [name] is a 61 y.o. female coming in with complaint of R knee pain. Patient states that her hamstrings and IT band are tight and she feels that in her knee. Wearing compression sleeve for pain relief. Does feel like knee pain has improved post PRP.       Past Medical History:  Diagnosis Date   Allergy    Anemia    Glaucoma    sees Dr. [name]    Past Surgical History:  Procedure Laterality Date   BREAST BIOPSY Left 2017   benign   COLONOSCOPY  02/25/2018   per Dr. [name], no polyps, repeat in 10 yrs    dental implant  2013   KNEE ARTHROSCOPY  2004   ACL Repair   MOUTH SURGERY     2 weeks ago- has a retainer in, off antibiotics   OOPHORECTOMY  1996   left   Social History   Socioeconomic History   Marital status: Married    Spouse name: Not on file   Number of children: Not on file   Years of education: Not on file   Highest education level: Bachelor's degree (e.g., BA, AB, BS)  Occupational History   Not on file  Tobacco Use   Smoking status: Never   Smokeless tobacco: Never  Substance and Sexual Activity   Alcohol use: Yes    Alcohol/week: 3.0 standard drinks of alcohol    Types: 3 Standard drinks or equivalent per week    Comment: occ   Drug use: No   Sexual activity: Not on file  Other Topics Concern   Not on file  Social History Narrative    Not on file   Social Determinants of Health   Financial Resource Strain: Low Risk  (07/16/2021)   Overall Financial Resource Strain (CARDIA)    Difficulty of Paying Living Expenses: Not hard at all  Food Insecurity: No Food Insecurity (07/16/2021)   Hunger Vital Sign    Worried About Running Out of Food in the Last Year: Never true    Ran Out of Food in the Last Year: Never true  Transportation Needs: No Transportation Needs (07/16/2021)   PRAPARE - Hydrologist (Medical): No    Lack of Transportation (Non-Medical): No  Physical Activity: Sufficiently Active (07/16/2021)   Exercise Vital Sign    Days of Exercise per Week: 5 days    Minutes of Exercise per Session: 90 min  Stress: No Stress Concern Present (07/16/2021)   [place]    Feeling of Stress : Not at all  Social Connections: Mercedes (07/16/2021)   Social Connection and Isolation Panel [NHANES]    Frequency of Communication with Friends and Family: More than three times a week    Frequency of Social Gatherings with Friends and  Family: More than three times a week    Attends Religious Services: More than 4 times per year    Active Member of Clubs or Organizations: Yes    Attends Music therapist: Not on file    Marital Status: Married   Allergies  Allergen Reactions   Penicillins     Per pt: allergic as child   Sulfamethoxazole-Trimethoprim Hives   Family History  Problem Relation Age of Onset   Alcohol abuse Other    Asthma Other    Hyperlipidemia Other    Hypertension Other    Heart disease Other    Breast cancer Maternal Aunt    Colon cancer Neg Hx    Stomach cancer Neg Hx    Esophageal cancer Neg Hx    Rectal cancer Neg Hx     Current Outpatient Medications (Endocrine & Metabolic):    Estradiol (DIVIGEL) 0.25 MG/0.25GM GEL, Divigel 1 mg/gram (0.1 %) transdermal gel packet  USE DAILY AS  DIRECTED   progesterone (PROMETRIUM) 100 MG capsule, Take 100 mg by mouth at bedtime.   progesterone (PROMETRIUM) 100 MG capsule, progesterone micronized 100 mg capsule   Current Outpatient Medications (Respiratory):    cetirizine (ZYRTEC) 10 MG tablet, Take by mouth.    Current Outpatient Medications (Other):    diclofenac Sodium (VOLTAREN ARTHRITIS PAIN) 1 % GEL, Apply 4 g topically 4 (four) times daily.   latanoprost (XALATAN) 0.005 % ophthalmic solution, Place 1 drop into both eyes at bedtime.   Multiple Vitamins-Minerals (CENTRUM SILVER ULTRA WOMENS PO), Take by mouth daily.   tiZANidine (ZANAFLEX) 4 MG tablet, Take 1 tablet (4 mg total) by mouth Nightly.   zolpidem (AMBIEN) 10 MG tablet, Take 1 tablet (10 mg total) by mouth at bedtime as needed for sleep.   Reviewed prior external information including notes and imaging from  primary care provider As well as notes that were available from care everywhere and other healthcare systems.  Past medical history, social, surgical and family history all reviewed in electronic medical record.  No pertanent information unless stated regarding to the chief complaint.   Review of Systems:  No headache, visual changes, nausea, vomiting, diarrhea, constipation, dizziness, abdominal pain, skin rash, fevers, chills, night sweats, weight loss, swollen lymph nodes, joint swelling, chest pain, shortness of breath, mood changes. POSITIVE muscle aches, body aches  Objective  Blood pressure 118/82, pulse 70, height '5\' 2"'$  (1.575 m), weight 146 lb (66.2 kg), SpO2 98 %.   General: No apparent distress alert and oriented x3 mood and affect normal, dressed appropriately.  HEENT: Pupils equal, extraocular movements intact  Respiratory: Patient's speak in full sentences and does not appear short of breath  Cardiovascular: No lower extremity edema, non tender, no erythema  Right knee exam does have some trace effusion noted.  Patient lacks last 5 degrees  of extension and flexion.  Instability with valgus and varus force.  Less so effusion than previous exam it appears to me.  After informed written and verbal consent, patient was seated on exam table. Right knee was prepped with alcohol swab and utilizing anterolateral approach, patient's right knee space was injected with 4:1  marcaine 0.5%: Kenalog '40mg'$ /dL. Patient tolerated the procedure well without immediate complications.    Impression and Recommendations:    The above documentation has been reviewed and is accurate and complete [name], DO

## 2021-07-27 ENCOUNTER — Encounter: Payer: Self-pay | Admitting: Family Medicine

## 2021-07-27 ENCOUNTER — Ambulatory Visit (INDEPENDENT_AMBULATORY_CARE_PROVIDER_SITE_OTHER): Payer: BC Managed Care – PPO | Admitting: Family Medicine

## 2021-07-27 DIAGNOSIS — M1711 Unilateral primary osteoarthritis, right knee: Secondary | ICD-10-CM | POA: Diagnosis not present

## 2021-07-27 NOTE — Patient Instructions (Addendum)
Injected knee today Have a great trip! See you at your next visit

## 2021-07-27 NOTE — Assessment & Plan Note (Signed)
Are chronic problem with worsening symptoms again.  Known to have severe arthritic changes in the medial compartment.  Patient is traveling and wanted to feel better.  We discussed the possibility of PRP again.  Patient wants to do that at a later date but wanted a steroid today to see if they will be helpful.  Patient given a steroid and understands signs and symptoms and when to monitor for any type of side effects.  Or any type of signs of infection.  Follow-up with me again as scheduled.  Consider PRP at follow-up

## 2021-08-01 ENCOUNTER — Encounter: Payer: Self-pay | Admitting: Orthopaedic Surgery

## 2021-08-01 ENCOUNTER — Ambulatory Visit (INDEPENDENT_AMBULATORY_CARE_PROVIDER_SITE_OTHER): Payer: BC Managed Care – PPO | Admitting: Orthopaedic Surgery

## 2021-08-01 DIAGNOSIS — M170 Bilateral primary osteoarthritis of knee: Secondary | ICD-10-CM | POA: Diagnosis not present

## 2021-08-01 DIAGNOSIS — M67471 Ganglion, right ankle and foot: Secondary | ICD-10-CM

## 2021-08-01 NOTE — Progress Notes (Signed)
Office Visit Note   Patient: Mercedes [name]           Date of Birth: 1960/05/14           MRN: [id number] Visit Date: 08/01/2021              Requested by: [name], MD [place],  [place] [id number] PCP: [name], MD   Assessment & Plan: Visit Diagnoses:  1. Bilateral primary osteoarthritis of knee   2. Ganglion cyst of right foot     Plan: [name] has bilateral knee osteoarthritis in all 3 compartments.  She has a long history of knee problems dating back many years.  She has had a prior ACL reconstruction of her left knee and more recently had an effusion of the right knee treated by one of the sports medicine physicians with PRP.  He is actually feeling much better.  Long discussion regarding her arthritis.  I did review her x-rays from May of this year and she does have tricompartmental changes predominantly in the medial compartment with there is narrowing, as subchondral sclerosis and peripheral osteophytes.  Presently she is doing very well but I did discuss further treatment options over time including repeat PRP, cortisone, viscosupplementation or even knee replacement and discussed some of those in detail.  For the moment she is doing well.  She also has developed a ganglion cyst on the dorsum of her right foot.  I aspirated this with typical ganglionic fluid and injected cortisone and applied a Band-Aid and we will plan to see her back as needed.  She is fully aware that these have a history of recurring quite frequently  Follow-Up Instructions: Return if symptoms worsen or fail to improve.   Orders:  Orders Placed This Encounter  Procedures   Incision & Drainage   No orders of the defined types were placed in this encounter.     Procedures: Incision & Drainage  Date/Time: 08/01/2021 4:59 PM  Performed by: Garald Balding, MD Authorized by: Garald Balding, MD   Consent:    Consent obtained:  Verbal   Consent given by:   Patient   Risks, benefits, and alternatives were discussed: yes     Risks discussed:  Incomplete drainage   Alternatives discussed:  Alternative treatment Location:    Type:  Ganglion cyst   Location:  Lower extremity   Lower extremity location:  Foot   Foot location:  R foot Pre-procedure details:    Skin preparation:  Alcohol and povidone-iodine Sedation:    Sedation type:  None Anesthesia:    Anesthesia method:  Local infiltration   Local anesthetic:  Lidocaine 1% w/o epi Procedure type:    Complexity:  Simple Procedure details:    Needle aspiration: yes     Needle size:  18 G Post-procedure details:    Procedure completion:  Tolerated well, no immediate complications    Clinical Data: No additional findings.   Subjective: Chief Complaint  Patient presents with   Left Knee - Follow-up   Right Knee - Follow-up   Patient presents today to discuss her bilateral knee pain.  Right Knee With her right knee she has had recent xray on 06/19/2021 and has had MRI on 07/09/2021. She states that on 06/28/2021 while she was line dancing she injured her right knee. She states that she was having increased swelling and was unable to walk. Patient had PRP in her right knee around 1  week ago and she is scheduled to have repeat PRP in her right knee on 08/22/2021. Patient denies having any previous surgeries in her right knee.  Also has noted a mass in the dorsum of her right foot that has been somewhat uncomfortable.  No injury or trauma.  Left Knee With patient's left knee she states that on 05/08/2021 while she was playing tennis she tore her mensicus. This was informed to her by her doctor using a ultrasound machine. She has had previous ACL repair on her left knee in 2003.  At this time patient has no been using a knee brace for her left or right knee. Zanaflex is being used at night to help patient manage her pain along with using ice.   Review of Systems   Objective: Vital Signs:  There were no vitals taken for this visit.  Physical Exam Constitutional:      Appearance: She is well-developed.  Eyes:     Pupils: Pupils are equal, round, and reactive to light.  Pulmonary:     Effort: Pulmonary effort is normal.  Skin:    General: Skin is warm and dry.  Neurological:     Mental Status: She is alert and oriented to person, place, and time.  Psychiatric:        Behavior: Behavior normal.     Ortho Exam no effusion either knee.  There was full extension of flex at least to 100 degrees without instability.  Maybe slight varus bilaterally with some very minimal medial joint pain.  No crepitation along the medial lateral joint line.  Some patella crepitation but no pain with compression.  No popliteal pain or mass.  No calf pain  Specialty Comments:  No specialty comments available.  Imaging: No results found.   PMFS History: Patient Active Problem List   Diagnosis Date Noted   Bilateral primary osteoarthritis of knee 08/01/2021   Ganglion cyst of right foot 08/01/2021   Acute tear medial meniscus, left, initial encounter 05/08/2021   Unilateral primary osteoarthritis, right knee 06/01/2020   Right knee pain 03/30/2020   Right foot pain 03/30/2020   Glaucoma 09/01/2019   Personal history of colonic adenoma 07/17/2011   ALLERGIC RHINITIS 03/02/2009   ANEMIA-NOS 12/23/2006   Past Medical History:  Diagnosis Date   Allergy    Anemia    Glaucoma    sees Dr. [name]     Family History  Problem Relation Age of Onset   Alcohol abuse Other    Asthma Other    Hyperlipidemia Other    Hypertension Other    Heart disease Other    Breast cancer Maternal Aunt    Colon cancer Neg Hx    Stomach cancer Neg Hx    Esophageal cancer Neg Hx    Rectal cancer Neg Hx     Past Surgical History:  Procedure Laterality Date   BREAST BIOPSY Left 2017   benign   COLONOSCOPY  02/25/2018   per Dr. [name], no polyps, repeat in 10 yrs    dental implant  2013    KNEE ARTHROSCOPY  2004   ACL Repair   MOUTH SURGERY     2 weeks ago- has a retainer in, off antibiotics   OOPHORECTOMY  1996   left   Social History   Occupational History   Not on file  Tobacco Use   Smoking status: Never   Smokeless tobacco: Never  Substance and Sexual Activity   Alcohol use: Yes  Alcohol/week: 3.0 standard drinks of alcohol    Types: 3 Standard drinks or equivalent per week    Comment: occ   Drug use: No   Sexual activity: Not on file

## 2021-08-02 ENCOUNTER — Ambulatory Visit (INDEPENDENT_AMBULATORY_CARE_PROVIDER_SITE_OTHER): Payer: BC Managed Care – PPO | Admitting: Podiatry

## 2021-08-02 ENCOUNTER — Other Ambulatory Visit: Payer: Self-pay | Admitting: Family Medicine

## 2021-08-02 ENCOUNTER — Other Ambulatory Visit: Payer: Self-pay | Admitting: Podiatry

## 2021-08-02 ENCOUNTER — Telehealth: Payer: Self-pay | Admitting: Orthopaedic Surgery

## 2021-08-02 DIAGNOSIS — L603 Nail dystrophy: Secondary | ICD-10-CM

## 2021-08-02 DIAGNOSIS — Z79899 Other long term (current) drug therapy: Secondary | ICD-10-CM

## 2021-08-02 NOTE — Progress Notes (Signed)
cyst

## 2021-08-02 NOTE — Telephone Encounter (Signed)
Pt called requesting a call back from Dr. Durward Fortes. Pt states they discussed knee replacement but did not discuss which dr in our office Dr. Durward Fortes recommends. Pt is asking for a call back because she was thinking Dr. Ninfa Linden and wanted to research which every dr Dr Durward Fortes recommends. Please call pt at [phone].

## 2021-08-02 NOTE — Progress Notes (Signed)
Subjective:  Patient ID: Mercedes [name], female    DOB: 1961/01/13,  MRN: [id number]  Chief Complaint  Patient presents with   Cyst    R cyst on top of foot -not diabetic    61 y.o. female presents with the above complaint.  Patient presents with thickened elongated dystrophic mycotic toenails x2 bilateral hallux.  Mild pain on palpation.  Patient states that she has tried all the over-the-counter options none of which has helped.  She would like to discuss other options for this.  She denies any other acute complaints.  She had it drained on top of her foot that was drained out by an orthopedic provider.  She denies any other acute issues.   Review of Systems: Negative except as noted in the HPI. Denies N/V/F/Ch.  Past Medical History:  Diagnosis Date   Allergy    Anemia    Glaucoma    sees Dr. [name]     Current Outpatient Medications:    cetirizine (ZYRTEC) 10 MG tablet, Take by mouth., Disp: , Rfl:    diclofenac Sodium (VOLTAREN ARTHRITIS PAIN) 1 % GEL, Apply 4 g topically 4 (four) times daily., Disp: 150 g, Rfl: 5   Estradiol (DIVIGEL) 0.25 MG/0.25GM GEL, Divigel 1 mg/gram (0.1 %) transdermal gel packet  USE DAILY AS DIRECTED, Disp: , Rfl:    latanoprost (XALATAN) 0.005 % ophthalmic solution, Place 1 drop into both eyes at bedtime., Disp: 2.5 mL, Rfl: 12   Multiple Vitamins-Minerals (CENTRUM SILVER ULTRA WOMENS PO), Take by mouth daily., Disp: , Rfl:    progesterone (PROMETRIUM) 100 MG capsule, Take 100 mg by mouth at bedtime., Disp: , Rfl: 12   progesterone (PROMETRIUM) 100 MG capsule, progesterone micronized 100 mg capsule, Disp: , Rfl:    tiZANidine (ZANAFLEX) 4 MG tablet, TAKE 1 TABLET BY MOUTH EVERY DAY NIGHTLY, Disp: 30 tablet, Rfl: 0   zolpidem (AMBIEN) 10 MG tablet, Take 1 tablet (10 mg total) by mouth at bedtime as needed for sleep., Disp: 15 tablet, Rfl: 0  Social History   Tobacco Use  Smoking Status Never  Smokeless Tobacco Never    Allergies   Allergen Reactions   Penicillins     Per pt: allergic as child   Sulfamethoxazole-Trimethoprim Hives   Objective:  There were no vitals filed for this visit. There is no height or weight on file to calculate BMI. Constitutional Well developed. Well nourished.  Vascular Dorsalis pedis pulses palpable bilaterally. Posterior tibial pulses palpable bilaterally. Capillary refill normal to all digits.  No cyanosis or clubbing noted. Pedal hair growth normal.  Neurologic Normal speech. Oriented to person, place, and time. Epicritic sensation to light touch grossly present bilaterally.  Dermatologic Nails thickened elongated dystrophic mycotic toenails x2 bilateral hallux Skin within normal limits  Orthopedic: Normal joint ROM without pain or crepitus bilaterally. No visible deformities. No bony tenderness.   Radiographs: None Assessment:   1. Onychodystrophy    Plan:  Patient was evaluated and treated and all questions answered.  Bilateral hallux onychomycosis -Educated the patient on the etiology of onychomycosis and various treatment options associated with improving the fungal load.  I explained to the patient that there is 3 treatment options available to treat the onychomycosis including topical, p.o., laser treatment.  Patient elected to undergo p.o. options with Lamisil/terbinafine therapy.  In order for me to start the medication therapy, I explained to the patient the importance of evaluating the liver and obtaining the liver function test.  Once the  liver function test comes back normal I will start him on 3-monthcourse of Lamisil therapy.  Patient understood all risk and would like to proceed with Lamisil therapy.  I have asked the patient to immediately stop the Lamisil therapy if she has any reactions to it and call the office or go to the emergency room right away.  Patient states understanding   No follow-ups on file.

## 2021-08-03 LAB — HEPATIC FUNCTION PANEL
ALT: 15 [IU]/L (ref 0–32)
AST: 19 [IU]/L (ref 0–40)
Albumin: 5.2 g/dL — ABNORMAL HIGH (ref 3.8–4.8)
Alkaline Phosphatase: 89 [IU]/L (ref 44–121)
Bilirubin Total: 0.6 mg/dL (ref 0.0–1.2)
Bilirubin, Direct: 0.15 mg/dL (ref 0.00–0.40)
Total Protein: 7.4 g/dL (ref 6.0–8.5)

## 2021-08-03 LAB — SPECIMEN STATUS REPORT

## 2021-08-03 MED ORDER — TERBINAFINE HCL 250 MG PO TABS
250.0000 mg | ORAL_TABLET | Freq: Every day | ORAL | 0 refills | Status: DC
Start: 2021-08-03 — End: 2022-05-28

## 2021-08-03 NOTE — Telephone Encounter (Signed)
Yes-refer to Dr Ninfa Linden

## 2021-08-08 ENCOUNTER — Encounter: Payer: Self-pay | Admitting: Orthopaedic Surgery

## 2021-08-08 ENCOUNTER — Ambulatory Visit (INDEPENDENT_AMBULATORY_CARE_PROVIDER_SITE_OTHER): Payer: BC Managed Care – PPO | Admitting: Orthopaedic Surgery

## 2021-08-08 DIAGNOSIS — M1712 Unilateral primary osteoarthritis, left knee: Secondary | ICD-10-CM | POA: Diagnosis not present

## 2021-08-08 DIAGNOSIS — M1711 Unilateral primary osteoarthritis, right knee: Secondary | ICD-10-CM | POA: Diagnosis not present

## 2021-08-08 DIAGNOSIS — M25561 Pain in right knee: Secondary | ICD-10-CM | POA: Diagnosis not present

## 2021-08-08 DIAGNOSIS — Z789 Other specified health status: Secondary | ICD-10-CM | POA: Diagnosis not present

## 2021-08-08 DIAGNOSIS — Z01419 Encounter for gynecological examination (general) (routine) without abnormal findings: Secondary | ICD-10-CM | POA: Diagnosis not present

## 2021-08-08 DIAGNOSIS — G8929 Other chronic pain: Secondary | ICD-10-CM

## 2021-08-08 DIAGNOSIS — M25562 Pain in left knee: Secondary | ICD-10-CM | POA: Diagnosis not present

## 2021-08-11 NOTE — Progress Notes (Signed)
Mercedes [name] Phone: [phone] Subjective:   [name], am serving as a scribe for Dr. Hulan Saas.   I'm seeing this patient by the request  of:  [name], MD  CC: Right knee pain  PPJ:[id number]  07/27/2021 Are chronic problem with worsening symptoms again.  Known to have severe arthritic changes in the medial compartment.  Patient is traveling and wanted to feel better.  We discussed the possibility of PRP again.  Patient wants to do that at a later date but wanted a steroid today to see if they will be helpful.  Patient given a steroid and understands signs and symptoms and when to monitor for any type of side effects.  Or any type of signs of infection.  Follow-up with me again as scheduled.  Consider PRP at follow-up  Update 08/22/2021 HALIMAH BEWICK is a 61 y.o. female coming in with complaint of R knee pain. Saw Dr. Rush Farmer since last visit. Is going to get visco series from Dr. Rush Farmer starting July 25th, 2023. Patient states that she was doing well until she went to [place] and walked around 6 miles a day. Wore brace entire time she was away. Notes swelling in knee following day after activity.   Patient also notes 3/10 pain in L knee but she might be getting visco in this knee as well but is unsure of Dr. Pryor Montes plan.   Also notes pain in R hip after doing a lot of walking.         Past Medical History:  Diagnosis Date   Allergy    Anemia    Glaucoma    sees Dr. [name]    Past Surgical History:  Procedure Laterality Date   BREAST BIOPSY Left 2017   benign   COLONOSCOPY  02/25/2018   per Dr. [name], no polyps, repeat in 10 yrs    dental implant  2013   KNEE ARTHROSCOPY  2004   ACL Repair   MOUTH SURGERY     2 weeks ago- has a retainer in, off antibiotics   OOPHORECTOMY  1996   left   Social History   Socioeconomic History   Marital status: Married    Spouse  name: Not on file   Number of children: Not on file   Years of education: Not on file   Highest education level: Bachelor's degree (e.g., BA, AB, BS)  Occupational History   Not on file  Tobacco Use   Smoking status: Never   Smokeless tobacco: Never  Substance and Sexual Activity   Alcohol use: Yes    Alcohol/week: 3.0 standard drinks of alcohol    Types: 3 Standard drinks or equivalent per week    Comment: occ   Drug use: No   Sexual activity: Not on file  Other Topics Concern   Not on file  Social History Narrative   Not on file   Social Determinants of Health   Financial Resource Strain: Low Risk  (07/16/2021)   Overall Financial Resource Strain (CARDIA)    Difficulty of Paying Living Expenses: Not hard at all  Food Insecurity: No Food Insecurity (07/16/2021)   Hunger Vital Sign    Worried About Running Out of Food in the Last Year: Never true    Ran Out of Food in the Last Year: Never true  Transportation Needs: No Transportation Needs (07/16/2021)   PRAPARE - Transportation    Lack  of Transportation (Medical): No    Lack of Transportation (Non-Medical): No  Physical Activity: Sufficiently Active (07/16/2021)   Exercise Vital Sign    Days of Exercise per Week: 5 days    Minutes of Exercise per Session: 90 min  Stress: No Stress Concern Present (07/16/2021)   [place]    Feeling of Stress : Not at all  Social Connections: Socially Integrated (07/16/2021)   Social Connection and Isolation Panel [NHANES]    Frequency of Communication with Friends and Family: More than three times a week    Frequency of Social Gatherings with Friends and Family: More than three times a week    Attends Religious Services: More than 4 times per year    Active Member of [employer] or Organizations: Yes    Attends Music therapist: Not on file    Marital Status: Married   Allergies  Allergen Reactions   Penicillins      Per pt: allergic as child   Sulfamethoxazole-Trimethoprim Hives   Family History  Problem Relation Age of Onset   Alcohol abuse Other    Asthma Other    Hyperlipidemia Other    Hypertension Other    Heart disease Other    Breast cancer Maternal Aunt    Colon cancer Neg Hx    Stomach cancer Neg Hx    Esophageal cancer Neg Hx    Rectal cancer Neg Hx     Current Outpatient Medications (Endocrine & Metabolic):    Estradiol (DIVIGEL) 0.25 MG/0.25GM GEL, Divigel 1 mg/gram (0.1 %) transdermal gel packet  USE DAILY AS DIRECTED   progesterone (PROMETRIUM) 100 MG capsule, Take 100 mg by mouth at bedtime.   progesterone (PROMETRIUM) 100 MG capsule, progesterone micronized 100 mg capsule   Current Outpatient Medications (Respiratory):    cetirizine (ZYRTEC) 10 MG tablet, Take by mouth.    Current Outpatient Medications (Other):    diclofenac Sodium (VOLTAREN ARTHRITIS PAIN) 1 % GEL, Apply 4 g topically 4 (four) times daily.   latanoprost (XALATAN) 0.005 % ophthalmic solution, Place 1 drop into both eyes at bedtime.   Multiple Vitamins-Minerals (CENTRUM SILVER ULTRA WOMENS PO), Take by mouth daily.   terbinafine (LAMISIL) 250 MG tablet, Take 1 tablet (250 mg total) by mouth daily.   tiZANidine (ZANAFLEX) 4 MG tablet, TAKE 1 TABLET BY MOUTH EVERY DAY NIGHTLY   zolpidem (AMBIEN) 10 MG tablet, Take 1 tablet (10 mg total) by mouth at bedtime as needed for sleep.   Objective  Blood pressure 106/72, pulse 76, height '5\' 2"'$  (1.575 m), weight 144 lb (65.3 kg), SpO2 96 %.   General: No apparent distress alert and oriented x3 mood and affect normal, dressed appropriately.  HEENT: Pupils equal, extraocular movements intact  Respiratory: Patient's speak in full sentences and does not appear short of breath  Cardiovascular: No lower extremity edema, non tender, no erythema   After informed written and verbal consent, patient was seated on exam table. Right knee was prepped with alcohol swab and  utilizing anterolateral approach, patient's right knee space was injected with 2 cc of 0.5% Marcaine with a 21-gauge 2 inch needle then injected 5 cc of PRP.  No blood loss.  Band-Aid placed.  Postinjection instructions given.  Follow-up    Impression and Recommendations:     The above documentation has been reviewed and is accurate and complete [name], DO \

## 2021-08-22 ENCOUNTER — Ambulatory Visit (INDEPENDENT_AMBULATORY_CARE_PROVIDER_SITE_OTHER): Payer: Self-pay | Admitting: Family Medicine

## 2021-08-22 ENCOUNTER — Encounter: Payer: Self-pay | Admitting: Family Medicine

## 2021-08-22 ENCOUNTER — Ambulatory Visit: Payer: BC Managed Care – PPO | Admitting: Orthopaedic Surgery

## 2021-08-22 ENCOUNTER — Telehealth: Payer: Self-pay | Admitting: Orthopaedic Surgery

## 2021-08-22 DIAGNOSIS — M1711 Unilateral primary osteoarthritis, right knee: Secondary | ICD-10-CM

## 2021-08-22 NOTE — Patient Instructions (Addendum)
No ice or IBU for 3 days Heat and Tylenol are ok See me again in 7-8 weeks

## 2021-08-22 NOTE — Telephone Encounter (Signed)
Talked with patient concerning questions for TriVisc gel injection.  Advised her that portal is down and will submit on Wednesday, 08/23/2021. Patient voiced that she understands.

## 2021-08-22 NOTE — Assessment & Plan Note (Signed)
Repeat PRP given again.  Patient did do a lot of walking and feels like this could have potentially contributed to some of her worsening pain again.  Discussed we could potentially consider 3 injections of PRP.  Otherwise need to consider the possibility of surgical intervention.  Follow-up with me again 6 to 8 weeks

## 2021-08-22 NOTE — Telephone Encounter (Signed)
Pt has lots of questions for about upcoming gel injection. Please call as soon possible at [phone].

## 2021-08-24 ENCOUNTER — Telehealth: Payer: Self-pay

## 2021-08-24 NOTE — Telephone Encounter (Signed)
Submitted for TriVisc, bilateral knee online through [facility]. Patient is aware that she will receive a call to pay for gel injections.

## 2021-08-29 ENCOUNTER — Ambulatory Visit (INDEPENDENT_AMBULATORY_CARE_PROVIDER_SITE_OTHER): Payer: BC Managed Care – PPO | Admitting: Family Medicine

## 2021-08-29 ENCOUNTER — Encounter: Payer: Self-pay | Admitting: Family Medicine

## 2021-08-29 VITALS — BP 110/72 | HR 73 | Temp 98.7°F | Ht 63.5 in | Wt 144.0 lb

## 2021-08-29 DIAGNOSIS — Z0000 Encounter for general adult medical examination without abnormal findings: Secondary | ICD-10-CM

## 2021-08-29 LAB — HEPATIC FUNCTION PANEL
ALT: 20 U/L (ref 0–35)
AST: 21 U/L (ref 0–37)
Albumin: 4.7 g/dL (ref 3.5–5.2)
Alkaline Phosphatase: 69 U/L (ref 39–117)
Bilirubin, Direct: 0.1 mg/dL (ref 0.0–0.3)
Total Bilirubin: 0.6 mg/dL (ref 0.2–1.2)
Total Protein: 7.2 g/dL (ref 6.0–8.3)

## 2021-08-29 LAB — BASIC METABOLIC PANEL
BUN: 19 mg/dL (ref 6–23)
CO2: 26 mEq/L (ref 19–32)
Calcium: 9.2 mg/dL (ref 8.4–10.5)
Chloride: 104 mEq/L (ref 96–112)
Creatinine, Ser: 0.64 mg/dL (ref 0.40–1.20)
GFR: 94.78 mL/min (ref >60.00)
Glucose, Bld: 94 mg/dL (ref 70–99)
Potassium: 4.5 mEq/L (ref 3.5–5.1)
Sodium: 138 mEq/L (ref 135–145)

## 2021-08-29 LAB — CBC WITH DIFFERENTIAL/PLATELET
Basophils Absolute: 0 10*3/uL (ref 0.0–0.1)
Basophils Relative: 0.7 % (ref 0.0–3.0)
Eosinophils Absolute: 0.1 10*3/uL (ref 0.0–0.7)
Eosinophils Relative: 2.4 % (ref 0.0–5.0)
HCT: 39.6 % (ref 36.0–46.0)
Hemoglobin: 13.6 g/dL (ref 12.0–15.0)
Lymphocytes Relative: 23 % (ref 12.0–46.0)
Lymphs Abs: 1.2 10*3/uL (ref 0.7–4.0)
MCHC: 34.4 g/dL (ref 30.0–36.0)
MCV: 88.6 fl (ref 78.0–100.0)
Monocytes Absolute: 0.3 10*3/uL (ref 0.1–1.0)
Monocytes Relative: 6.4 % (ref 3.0–12.0)
Neutro Abs: 3.5 10*3/uL (ref 1.4–7.7)
Neutrophils Relative %: 67.5 % (ref 43.0–77.0)
Platelets: 314 10*3/uL (ref 150.0–400.0)
RBC: 4.46 Mil/uL (ref 3.87–5.11)
RDW: 13.2 % (ref 11.5–15.5)
WBC: 5.2 10*3/uL (ref 4.0–10.5)

## 2021-08-29 LAB — LIPID PANEL
Cholesterol: 191 mg/dL (ref 0–200)
HDL: 66 mg/dL (ref >39.00)
LDL Cholesterol: 85 mg/dL (ref 0–99)
NonHDL: 124.82
Total CHOL/HDL Ratio: 3
Triglycerides: 197 mg/dL — ABNORMAL HIGH (ref 0.0–149.0)
VLDL: 39.4 mg/dL (ref 0.0–40.0)

## 2021-08-29 LAB — TSH: TSH: 2.35 u[IU]/mL (ref 0.35–5.50)

## 2021-08-29 LAB — HEMOGLOBIN A1C: Hgb A1c MFr Bld: 5.4 % (ref 4.6–6.5)

## 2021-08-29 NOTE — Progress Notes (Signed)
   Subjective:    Patient ID: Mercedes [name], female    DOB: 1960/05/03, 60 y.o.   MRN: [id number]  HPI Here for a well exam. She is doing well overall. She is seeing Dr. Charlann Boxer for severe OA in the right knee. She recently received her second injection of PRP, and she thinks it is helping.    Review of Systems  Constitutional: Negative.   HENT: Negative.    Eyes: Negative.   Respiratory: Negative.    Cardiovascular: Negative.   Gastrointestinal: Negative.   Genitourinary:  Negative for decreased urine volume, difficulty urinating, dyspareunia, dysuria, enuresis, flank pain, frequency, hematuria, pelvic pain and urgency.  Musculoskeletal:  Positive for arthralgias.  Skin: Negative.   Neurological: Negative.  Negative for headaches.  Psychiatric/Behavioral: Negative.         Objective:   Physical Exam Constitutional:      General: She is not in acute distress.    Appearance: Normal appearance. She is well-developed.  HENT:     Head: Normocephalic and atraumatic.     Right Ear: External ear normal.     Left Ear: External ear normal.     Nose: Nose normal.     Mouth/Throat:     Pharynx: No oropharyngeal exudate.  Eyes:     General: No scleral icterus.    Conjunctiva/sclera: Conjunctivae normal.     Pupils: Pupils are equal, round, and reactive to light.  Neck:     Thyroid: No thyromegaly.     Vascular: No JVD.  Cardiovascular:     Rate and Rhythm: Normal rate and regular rhythm.     Heart sounds: Normal heart sounds. No murmur heard.    No friction rub. No gallop.  Pulmonary:     Effort: Pulmonary effort is normal. No respiratory distress.     Breath sounds: Normal breath sounds. No wheezing or rales.  Chest:     Chest wall: No tenderness.  Abdominal:     General: Bowel sounds are normal. There is no distension.     Palpations: Abdomen is soft. There is no mass.     Tenderness: There is no abdominal tenderness. There is no guarding or rebound.   Musculoskeletal:        General: No tenderness. Normal range of motion.     Cervical back: Normal range of motion and neck supple.  Lymphadenopathy:     Cervical: No cervical adenopathy.  Skin:    General: Skin is warm and dry.     Findings: No erythema or rash.  Neurological:     Mental Status: She is alert and oriented to person, place, and time.     Cranial Nerves: No cranial nerve deficit.     Motor: No abnormal muscle tone.     Coordination: Coordination normal.     Deep Tendon Reflexes: Reflexes are normal and symmetric. Reflexes normal.  Psychiatric:        Behavior: Behavior normal.        Thought Content: Thought content normal.        Judgment: Judgment normal.           Assessment & Plan:  Well exam. We discussed diet and exercise. Get fasting labs. Alysia Penna, MD

## 2021-08-31 ENCOUNTER — Encounter: Payer: Self-pay | Admitting: Family Medicine

## 2021-08-31 ENCOUNTER — Other Ambulatory Visit: Payer: Self-pay

## 2021-08-31 MED ORDER — TIZANIDINE HCL 4 MG PO TABS: 4.0000 mg | ORAL_TABLET | Freq: Every day | ORAL | 0 refills | Status: DC

## 2021-09-04 ENCOUNTER — Telehealth: Payer: Self-pay | Admitting: Orthopaedic Surgery

## 2021-09-04 NOTE — Telephone Encounter (Signed)
Pt called asking for an update about paying for gel injection. Pt cancelled appt until April calls and pt pays for gel injections.

## 2021-09-05 ENCOUNTER — Ambulatory Visit: Payer: BC Managed Care – PPO | Admitting: Orthopaedic Surgery

## 2021-09-07 ENCOUNTER — Ambulatory Visit: Payer: BC Managed Care – PPO | Admitting: Family Medicine

## 2021-09-11 NOTE — Telephone Encounter (Signed)
Called and left a Vm for patient to CB to discuss about TriVisc injection.

## 2021-09-18 ENCOUNTER — Telehealth: Payer: Self-pay

## 2021-09-18 NOTE — Telephone Encounter (Deleted)
TriVisc injections received for bilateral knee.

## 2021-09-18 NOTE — Telephone Encounter (Addendum)
Talked with patient and appointments have been made for TriVisc, bilateral knee.

## 2021-09-22 DIAGNOSIS — R350 Frequency of micturition: Secondary | ICD-10-CM | POA: Diagnosis not present

## 2021-09-22 DIAGNOSIS — Z6825 Body mass index (BMI) 25.0-25.9, adult: Secondary | ICD-10-CM | POA: Diagnosis not present

## 2021-09-28 ENCOUNTER — Other Ambulatory Visit: Payer: Self-pay | Admitting: Family Medicine

## 2021-10-02 ENCOUNTER — Encounter: Payer: Self-pay | Admitting: Family Medicine

## 2021-10-04 ENCOUNTER — Ambulatory Visit (INDEPENDENT_AMBULATORY_CARE_PROVIDER_SITE_OTHER): Payer: BC Managed Care – PPO | Admitting: Orthopaedic Surgery

## 2021-10-04 ENCOUNTER — Encounter: Payer: Self-pay | Admitting: Orthopaedic Surgery

## 2021-10-04 DIAGNOSIS — M1711 Unilateral primary osteoarthritis, right knee: Secondary | ICD-10-CM

## 2021-10-04 DIAGNOSIS — M1712 Unilateral primary osteoarthritis, left knee: Secondary | ICD-10-CM | POA: Diagnosis not present

## 2021-10-04 MED ORDER — SODIUM HYALURONATE (VISCOSUP) 25 MG/2.5ML IX SOSY
25.0000 mg | PREFILLED_SYRINGE | INTRA_ARTICULAR | Status: AC | PRN
  Administered 2021-10-04: 25 mg via INTRA_ARTICULAR

## 2021-10-04 NOTE — Progress Notes (Signed)
   Procedure Note  Patient: Mercedes [name]             Date of Birth: [birth year]           MRN: [id number]             Visit Date: 10/04/2021  Procedures: Visit Diagnoses:  1. Unilateral primary osteoarthritis, right knee   2. Unilateral primary osteoarthritis, left knee     Large Joint Inj: bilateral knee on 10/04/2021 2:23 PM Indications: diagnostic evaluation and pain Details: 22 G 1.5 in needle, superolateral approach  Arthrogram: No  Medications (Right): 25 mg Sodium Hyaluronate (Viscosup) 25 MG/2.5ML Medications (Left): 25 mg Sodium Hyaluronate (Viscosup) 25 MG/2.5ML Outcome: tolerated well, no immediate complications Procedure, treatment alternatives, risks and benefits explained, specific risks discussed. Consent was given by the patient. Immediately prior to procedure a time out was called to verify the correct patient, procedure, equipment, support staff and site/side marked as required. Patient was prepped and draped in the usual sterile fashion.    Lot# [id number]  The patient comes in today for bilateral knee injections with Trivsic to treat the pain from osteoarthritis.  She has had no acute change in her medical status.  This is injection number one of the series of 3 injections for both knees.  I placed hyaluronic acid with Trivisc both knees today which she tolerated well.  We will see her back next week for injection #2 in each knee.  All question concerns were answered and addressed.

## 2021-10-06 DIAGNOSIS — M6281 Muscle weakness (generalized): Secondary | ICD-10-CM | POA: Diagnosis not present

## 2021-10-06 DIAGNOSIS — M25562 Pain in left knee: Secondary | ICD-10-CM | POA: Diagnosis not present

## 2021-10-06 DIAGNOSIS — M25561 Pain in right knee: Secondary | ICD-10-CM | POA: Diagnosis not present

## 2021-10-09 NOTE — Progress Notes (Unsigned)
[facility] [place] [address] [place] Phone: [phone] Subjective:   IVilma Meckel, am serving as a scribe for Dr. Hulan Saas.  I'm seeing this patient by the request  of:  [name], MD  CC: Knee pain follow-up  OMV:[id number]  08/22/2021 Repeat PRP given again.  Patient did do a lot of walking and feels like this could have potentially contributed to some of her worsening pain again.  Discussed we could potentially consider 3 injections of PRP.  Otherwise need to consider the possibility of surgical intervention.  Follow-up with me again 6 to 8 weeks  Updated 10/10/2021 TULEEN MANDELBAUM is a 61 y.o. female coming in with complaint of right knee pain. PRP f/u. Feels like she's getting better. Doesn't have full ROM. Hard to flex knee and tight after exercise. In PT right now. The intensity of the pain has gone down. Wants to know progression of swelling.      Past Medical History:  Diagnosis Date   Allergy    Anemia    Glaucoma    sees Dr. [name]    Past Surgical History:  Procedure Laterality Date   BREAST BIOPSY Left 2017   benign   COLONOSCOPY  02/25/2018   per Dr. [name], no polyps, repeat in 10 yrs    dental implant  2013   KNEE ARTHROSCOPY  2004   ACL Repair   MOUTH SURGERY     2 weeks ago- has a retainer in, off antibiotics   OOPHORECTOMY  1996   left   Social History   Socioeconomic History   Marital status: Married    Spouse name: Not on file   Number of children: Not on file   Years of education: Not on file   Highest education level: Bachelor's degree (e.g., BA, AB, BS)  Occupational History   Not on file  Tobacco Use   Smoking status: Never   Smokeless tobacco: Never  Substance and Sexual Activity   Alcohol use: Yes    Alcohol/week: 3.0 standard drinks of alcohol    Types: 3 Standard drinks or equivalent per week    Comment: occ   Drug use: No   Sexual activity: Not on file  Other  Topics Concern   Not on file  Social History Narrative   Not on file   Social Determinants of Health   Financial Resource Strain: Low Risk  (07/16/2021)   Overall Financial Resource Strain (CARDIA)    Difficulty of Paying Living Expenses: Not hard at all  Food Insecurity: No Food Insecurity (07/16/2021)   Hunger Vital Sign    Worried About Running Out of Food in the Last Year: Never true    Ran Out of Food in the Last Year: Never true  Transportation Needs: No Transportation Needs (07/16/2021)   PRAPARE - Hydrologist (Medical): No    Lack of Transportation (Non-Medical): No  Physical Activity: Sufficiently Active (07/16/2021)   Exercise Vital Sign    Days of Exercise per Week: 5 days    Minutes of Exercise per Session: 90 min  Stress: No Stress Concern Present (07/16/2021)   [place]    Feeling of Stress : Not at all  Social Connections: [place] (07/16/2021)   Social Connection and Isolation Panel [NHANES]    Frequency of Communication with Friends and Family: More than three times a week  Frequency of Social Gatherings with Friends and Family: More than three times a week    Attends Religious Services: More than 4 times per year    Active Member of [employer] or Organizations: Yes    Attends Music therapist: Not on file    Marital Status: Married   Allergies  Allergen Reactions   Penicillins     Per pt: allergic as child   Sulfamethoxazole-Trimethoprim Hives   Family History  Problem Relation Age of Onset   Alcohol abuse Other    Asthma Other    Hyperlipidemia Other    Hypertension Other    Heart disease Other    Breast cancer Maternal Aunt    Colon cancer Neg Hx    Stomach cancer Neg Hx    Esophageal cancer Neg Hx    Rectal cancer Neg Hx     Current Outpatient Medications (Endocrine & Metabolic):    Estradiol (DIVIGEL) 0.25 MG/0.25GM GEL, Divigel 1  mg/gram (0.1 %) transdermal gel packet  USE DAILY AS DIRECTED   progesterone (PROMETRIUM) 100 MG capsule, Take 100 mg by mouth at bedtime.   Current Outpatient Medications (Respiratory):    cetirizine (ZYRTEC) 10 MG tablet, Take by mouth.    Current Outpatient Medications (Other):    diclofenac Sodium (VOLTAREN ARTHRITIS PAIN) 1 % GEL, Apply 4 g topically 4 (four) times daily. (Patient taking differently: Apply 4 g topically as needed.)   latanoprost (XALATAN) 0.005 % ophthalmic solution, Place 1 drop into both eyes at bedtime.   Multiple Vitamins-Minerals (CENTRUM SILVER ULTRA WOMENS PO), Take by mouth daily.   terbinafine (LAMISIL) 250 MG tablet, Take 1 tablet (250 mg total) by mouth daily. (Patient not taking: Reported on 08/29/2021)   tiZANidine (ZANAFLEX) 4 MG tablet, TAKE 1 TABLET BY MOUTH AT BEDTIME.    Review of Systems:  No headache, visual changes, nausea, vomiting, diarrhea, constipation, dizziness, abdominal pain, skin rash, fevers, chills, night sweats, weight loss, swollen lymph nodes, body aches, joint swelling, chest pain, shortness of breath, mood changes. POSITIVE muscle aches  Objective  Pulse 69, height '5\' 3"'$  (1.6 m), weight 147 lb (66.7 kg), SpO2 96 %.   General: No apparent distress alert and oriented x3 mood and affect normal, dressed appropriately.  HEENT: Pupils equal, extraocular movements intact  Respiratory: Patient's speak in full sentences and does not appear short of breath  Cardiovascular: No lower extremity edema, non tender, no erythema  Bilateral knees do have some tenderness to palpation noted.  Patient does have some wound no significant though swelling in either side.  Mild crepitus with range of motion.    Impression and Recommendations:    The above documentation has been reviewed and is accurate and complete [name], DO

## 2021-10-10 ENCOUNTER — Ambulatory Visit (INDEPENDENT_AMBULATORY_CARE_PROVIDER_SITE_OTHER): Payer: BC Managed Care – PPO | Admitting: Family Medicine

## 2021-10-10 ENCOUNTER — Encounter: Payer: Self-pay | Admitting: Family Medicine

## 2021-10-10 DIAGNOSIS — L570 Actinic keratosis: Secondary | ICD-10-CM | POA: Diagnosis not present

## 2021-10-10 DIAGNOSIS — M170 Bilateral primary osteoarthritis of knee: Secondary | ICD-10-CM

## 2021-10-10 DIAGNOSIS — M6281 Muscle weakness (generalized): Secondary | ICD-10-CM | POA: Diagnosis not present

## 2021-10-10 DIAGNOSIS — M25562 Pain in left knee: Secondary | ICD-10-CM | POA: Diagnosis not present

## 2021-10-10 DIAGNOSIS — M25561 Pain in right knee: Secondary | ICD-10-CM | POA: Diagnosis not present

## 2021-10-10 NOTE — Patient Instructions (Signed)
Good to see you! SO glad this has helped Can consider PRP in other knee

## 2021-10-10 NOTE — Assessment & Plan Note (Signed)
Patient has made some improvement after the PRP at this moment.  We discussed that we could potentially repeat if necessary.  Following up with another provider to get gel injections which I think is a good attempt to try to help improve patient's pain.  Follow-up with me on an as-needed basis otherwise.

## 2021-10-11 ENCOUNTER — Encounter: Payer: Self-pay | Admitting: Orthopaedic Surgery

## 2021-10-11 ENCOUNTER — Ambulatory Visit (INDEPENDENT_AMBULATORY_CARE_PROVIDER_SITE_OTHER): Payer: BC Managed Care – PPO | Admitting: Orthopaedic Surgery

## 2021-10-11 DIAGNOSIS — M1712 Unilateral primary osteoarthritis, left knee: Secondary | ICD-10-CM

## 2021-10-11 DIAGNOSIS — M1711 Unilateral primary osteoarthritis, right knee: Secondary | ICD-10-CM | POA: Diagnosis not present

## 2021-10-11 DIAGNOSIS — I8312 Varicose veins of left lower extremity with inflammation: Secondary | ICD-10-CM | POA: Diagnosis not present

## 2021-10-11 DIAGNOSIS — M170 Bilateral primary osteoarthritis of knee: Secondary | ICD-10-CM

## 2021-10-11 MED ORDER — SODIUM HYALURONATE (VISCOSUP) 25 MG/2.5ML IX SOSY
25.0000 mg | PREFILLED_SYRINGE | INTRA_ARTICULAR | Status: AC | PRN
  Administered 2021-10-11: 25 mg via INTRA_ARTICULAR

## 2021-10-11 NOTE — Progress Notes (Signed)
   Procedure Note  Patient: Mercedes [name]             Date of Birth: 30-Jun-1960           MRN: [id number]             Visit Date: 10/11/2021  Procedures: Visit Diagnoses:  1. Unilateral primary osteoarthritis, left knee   2. Unilateral primary osteoarthritis, right knee     Large Joint Inj: bilateral knee on 10/11/2021 1:06 PM Indications: diagnostic evaluation and pain Details: 22 G 1.5 in needle, superolateral approach  Arthrogram: No  Medications (Right): 25 mg Sodium Hyaluronate (Viscosup) 25 MG/2.5ML Medications (Left): 25 mg Sodium Hyaluronate (Viscosup) 25 MG/2.5ML Outcome: tolerated well, no immediate complications Procedure, treatment alternatives, risks and benefits explained, specific risks discussed. Consent was given by the patient. Immediately prior to procedure a time out was called to verify the correct patient, procedure, equipment, support staff and site/side marked as required. Patient was prepped and draped in the usual sterile fashion.    Lot# [id number]  The patient is here today for injection #2 of hyaluronic acid in both knees from a series of 3 injections.  She had no adverse reaction to injection #1 in both knees.  This is with Tri Visc.  Neither knee has an effusion today.  I placed Tri Visc #2 in each knee today which she tolerated well.  We will see her back next week for the third and final injection in the series of hyaluronic acid in both knees.

## 2021-10-13 DIAGNOSIS — M25561 Pain in right knee: Secondary | ICD-10-CM | POA: Diagnosis not present

## 2021-10-13 DIAGNOSIS — M25562 Pain in left knee: Secondary | ICD-10-CM | POA: Diagnosis not present

## 2021-10-13 DIAGNOSIS — M6281 Muscle weakness (generalized): Secondary | ICD-10-CM | POA: Diagnosis not present

## 2021-10-17 DIAGNOSIS — M25561 Pain in right knee: Secondary | ICD-10-CM | POA: Diagnosis not present

## 2021-10-17 DIAGNOSIS — M6281 Muscle weakness (generalized): Secondary | ICD-10-CM | POA: Diagnosis not present

## 2021-10-17 DIAGNOSIS — M25562 Pain in left knee: Secondary | ICD-10-CM | POA: Diagnosis not present

## 2021-10-18 ENCOUNTER — Ambulatory Visit (INDEPENDENT_AMBULATORY_CARE_PROVIDER_SITE_OTHER): Payer: BC Managed Care – PPO | Admitting: Podiatry

## 2021-10-18 ENCOUNTER — Ambulatory Visit (INDEPENDENT_AMBULATORY_CARE_PROVIDER_SITE_OTHER): Payer: BC Managed Care – PPO | Admitting: Orthopaedic Surgery

## 2021-10-18 ENCOUNTER — Encounter: Payer: Self-pay | Admitting: Orthopaedic Surgery

## 2021-10-18 DIAGNOSIS — B351 Tinea unguium: Secondary | ICD-10-CM | POA: Diagnosis not present

## 2021-10-18 DIAGNOSIS — M67479 Ganglion, unspecified ankle and foot: Secondary | ICD-10-CM

## 2021-10-18 DIAGNOSIS — M1711 Unilateral primary osteoarthritis, right knee: Secondary | ICD-10-CM

## 2021-10-18 DIAGNOSIS — L603 Nail dystrophy: Secondary | ICD-10-CM

## 2021-10-18 DIAGNOSIS — M1712 Unilateral primary osteoarthritis, left knee: Secondary | ICD-10-CM

## 2021-10-18 MED ORDER — SODIUM HYALURONATE (VISCOSUP) 25 MG/2.5ML IX SOSY
25.0000 mg | PREFILLED_SYRINGE | INTRA_ARTICULAR | Status: AC | PRN
  Administered 2021-10-18: 25 mg via INTRA_ARTICULAR

## 2021-10-18 NOTE — Patient Instructions (Signed)

## 2021-10-18 NOTE — Progress Notes (Signed)
Patient presents today for the 1st laser treatment. Diagnosed with onychodystrophy by Dr. Posey Pronto.    Toenail most affected bilateral hallux.   All other systems are negative.   Nails were filed thin. Laser therapy was administered to bilateral hallux and patient tolerated the treatment well. All safety precautions were in place.    Follow up in 4 weeks for laser # 2.

## 2021-10-18 NOTE — Progress Notes (Unsigned)
Subjective:  Patient ID: Mercedes [name], female    DOB: 03-08-1960,  MRN: [id number]  Chief Complaint  Patient presents with   Foot Pain    61 y.o. female presents with the above complaint.  Patient presents with right dorsal midfoot ganglion cyst/soft tissue mass as well as onychomycosis of bilateral hallux.  Patient states that the cyst seems to go up and down in size.  It is not bothersome.  Is very mild discomfort but she wanted to get it evaluated see if there is any options.  She denies seeing anyone else prior to seeing me for this.  She also wanted to discuss treatment options for nail fungus.  She does not want to do the oral medication or topical.   Review of Systems: Negative except as noted in the HPI. Denies N/V/F/Ch.  Past Medical History:  Diagnosis Date   Allergy    Anemia    Glaucoma    sees Dr. [name]     Current Outpatient Medications:    cetirizine (ZYRTEC) 10 MG tablet, Take by mouth., Disp: , Rfl:    diclofenac Sodium (VOLTAREN ARTHRITIS PAIN) 1 % GEL, Apply 4 g topically 4 (four) times daily. (Patient taking differently: Apply 4 g topically as needed.), Disp: 150 g, Rfl: 5   Estradiol (DIVIGEL) 0.25 MG/0.25GM GEL, Divigel 1 mg/gram (0.1 %) transdermal gel packet  USE DAILY AS DIRECTED, Disp: , Rfl:    latanoprost (XALATAN) 0.005 % ophthalmic solution, Place 1 drop into both eyes at bedtime., Disp: 2.5 mL, Rfl: 12   Multiple Vitamins-Minerals (CENTRUM SILVER ULTRA WOMENS PO), Take by mouth daily., Disp: , Rfl:    progesterone (PROMETRIUM) 100 MG capsule, Take 100 mg by mouth at bedtime., Disp: , Rfl: 12   terbinafine (LAMISIL) 250 MG tablet, Take 1 tablet (250 mg total) by mouth daily. (Patient not taking: Reported on 08/29/2021), Disp: 90 tablet, Rfl: 0   tiZANidine (ZANAFLEX) 4 MG tablet, TAKE 1 TABLET BY MOUTH AT BEDTIME., Disp: 30 tablet, Rfl: 0  Social History   Tobacco Use  Smoking Status Never  Smokeless Tobacco Never    Allergies  Allergen  Reactions   Penicillins     Per pt: allergic as child   Sulfamethoxazole-Trimethoprim Hives   Objective:  There were no vitals filed for this visit. There is no height or weight on file to calculate BMI. Constitutional Well developed. Well nourished.  Vascular Dorsalis pedis pulses palpable bilaterally. Posterior tibial pulses palpable bilaterally. Capillary refill normal to all digits.  No cyanosis or clubbing noted. Pedal hair growth normal.  Neurologic Normal speech. Oriented to person, place, and time. Epicritic sensation to light touch grossly present bilaterally.  Dermatologic Nails thickened elongated dystrophic mycotic nails x2 bilateral hallux Ganglion cyst noted to the midfoot fluctuance with positive transillumination.  Not indurated.  Appears to be single lobulated likely from a joint  Orthopedic: Normal joint ROM without pain or crepitus bilaterally. No visible deformities. No bony tenderness.   Radiographs: None Assessment:   1. Ganglion cyst of foot   2. Onychodystrophy   3. Dermatophytosis of nail    Plan:  Patient was evaluated and treated and all questions answered.  Right dorsal midfoot ganglion cyst -All questions and concerns were discussed with the patient in extensive detail ultimately discussed with her that she will benefit from surgical surgical excision of the cyst if it becomes painful.  She will think about the surgery and get back to me if becomes bothersome  Bilateral  hallux onychomycosis -Educated the patient on the etiology of onychomycosis and various treatment options associated with improving the fungal load.  I explained to the patient that there is 3 treatment options available to treat the onychomycosis including topical, p.o., laser treatment.  Patient elected to undergo laser therapy.  I discussed with her we will take 6-7 treatment for resolve meant.  She states understanding   No follow-ups on file.

## 2021-10-18 NOTE — Progress Notes (Signed)
   Procedure Note  Patient: Mercedes [name]             Date of Birth: 1960-10-24           MRN: [id number]             Visit Date: 10/18/2021  Procedures: Visit Diagnoses:  1. Unilateral primary osteoarthritis, right knee   2. Unilateral primary osteoarthritis, left knee     Large Joint Inj: bilateral knee on 10/18/2021 2:39 PM Indications: diagnostic evaluation and pain Details: 22 G 1.5 in needle, superolateral approach  Arthrogram: No  Medications (Right): 25 mg Sodium Hyaluronate (Viscosup) 25 MG/2.5ML Medications (Left): 25 mg Sodium Hyaluronate (Viscosup) 25 MG/2.5ML Outcome: tolerated well, no immediate complications Procedure, treatment alternatives, risks and benefits explained, specific risks discussed. Consent was given by the patient. Immediately prior to procedure a time out was called to verify the correct patient, procedure, equipment, support staff and site/side marked as required. Patient was prepped and draped in the usual sterile fashion.    [name] is here for injection #3 of a series of 3 hyaluronic acid injections in each knee to treat the pain from osteoarthritis.  She has had no significant adverse reactions to the first 2 injections.  She did have some slight knee swelling for 1 day but that dissipated.  She is very active.  She still runs in a pool and does other activities.  Both knees today show no effusion.  I did place Tri Visc in each knee today which she tolerated well.  This is the last injection in each knee.  All questions and concerns were answered and addressed.  If things worsen anyway or she is the point where she would like to proceed with knee replacement surgery she knows to contact us.  All questions and concerns were answered and addressed.

## 2021-10-19 DIAGNOSIS — M25562 Pain in left knee: Secondary | ICD-10-CM | POA: Diagnosis not present

## 2021-10-19 DIAGNOSIS — M6281 Muscle weakness (generalized): Secondary | ICD-10-CM | POA: Diagnosis not present

## 2021-10-19 DIAGNOSIS — M25561 Pain in right knee: Secondary | ICD-10-CM | POA: Diagnosis not present

## 2021-10-26 ENCOUNTER — Other Ambulatory Visit: Payer: Self-pay | Admitting: Family Medicine

## 2021-10-30 DIAGNOSIS — R051 Acute cough: Secondary | ICD-10-CM | POA: Diagnosis not present

## 2021-10-30 DIAGNOSIS — J0100 Acute maxillary sinusitis, unspecified: Secondary | ICD-10-CM | POA: Diagnosis not present

## 2021-10-30 DIAGNOSIS — Z6827 Body mass index (BMI) 27.0-27.9, adult: Secondary | ICD-10-CM | POA: Diagnosis not present

## 2021-11-02 DIAGNOSIS — M25562 Pain in left knee: Secondary | ICD-10-CM | POA: Diagnosis not present

## 2021-11-02 DIAGNOSIS — M6281 Muscle weakness (generalized): Secondary | ICD-10-CM | POA: Diagnosis not present

## 2021-11-02 DIAGNOSIS — M25561 Pain in right knee: Secondary | ICD-10-CM | POA: Diagnosis not present

## 2021-11-07 DIAGNOSIS — M25561 Pain in right knee: Secondary | ICD-10-CM | POA: Diagnosis not present

## 2021-11-07 DIAGNOSIS — M25562 Pain in left knee: Secondary | ICD-10-CM | POA: Diagnosis not present

## 2021-11-07 DIAGNOSIS — M6281 Muscle weakness (generalized): Secondary | ICD-10-CM | POA: Diagnosis not present

## 2021-11-08 DIAGNOSIS — H60392 Other infective otitis externa, left ear: Secondary | ICD-10-CM | POA: Diagnosis not present

## 2021-11-08 DIAGNOSIS — M6281 Muscle weakness (generalized): Secondary | ICD-10-CM | POA: Diagnosis not present

## 2021-11-08 DIAGNOSIS — M25562 Pain in left knee: Secondary | ICD-10-CM | POA: Diagnosis not present

## 2021-11-08 DIAGNOSIS — M25561 Pain in right knee: Secondary | ICD-10-CM | POA: Diagnosis not present

## 2021-11-13 DIAGNOSIS — M25561 Pain in right knee: Secondary | ICD-10-CM | POA: Diagnosis not present

## 2021-11-13 DIAGNOSIS — M25562 Pain in left knee: Secondary | ICD-10-CM | POA: Diagnosis not present

## 2021-11-13 DIAGNOSIS — M6281 Muscle weakness (generalized): Secondary | ICD-10-CM | POA: Diagnosis not present

## 2021-11-16 DIAGNOSIS — M25561 Pain in right knee: Secondary | ICD-10-CM | POA: Diagnosis not present

## 2021-11-16 DIAGNOSIS — M6281 Muscle weakness (generalized): Secondary | ICD-10-CM | POA: Diagnosis not present

## 2021-11-16 DIAGNOSIS — M25562 Pain in left knee: Secondary | ICD-10-CM | POA: Diagnosis not present

## 2021-11-21 DIAGNOSIS — M6281 Muscle weakness (generalized): Secondary | ICD-10-CM | POA: Diagnosis not present

## 2021-11-21 DIAGNOSIS — M25562 Pain in left knee: Secondary | ICD-10-CM | POA: Diagnosis not present

## 2021-11-21 DIAGNOSIS — M25561 Pain in right knee: Secondary | ICD-10-CM | POA: Diagnosis not present

## 2021-11-24 DIAGNOSIS — M25561 Pain in right knee: Secondary | ICD-10-CM | POA: Diagnosis not present

## 2021-11-24 DIAGNOSIS — M6281 Muscle weakness (generalized): Secondary | ICD-10-CM | POA: Diagnosis not present

## 2021-11-24 DIAGNOSIS — M25562 Pain in left knee: Secondary | ICD-10-CM | POA: Diagnosis not present

## 2021-11-28 DIAGNOSIS — M6281 Muscle weakness (generalized): Secondary | ICD-10-CM | POA: Diagnosis not present

## 2021-11-28 DIAGNOSIS — I8312 Varicose veins of left lower extremity with inflammation: Secondary | ICD-10-CM | POA: Diagnosis not present

## 2021-11-28 DIAGNOSIS — M25562 Pain in left knee: Secondary | ICD-10-CM | POA: Diagnosis not present

## 2021-11-28 DIAGNOSIS — M25561 Pain in right knee: Secondary | ICD-10-CM | POA: Diagnosis not present

## 2021-12-01 ENCOUNTER — Ambulatory Visit (INDEPENDENT_AMBULATORY_CARE_PROVIDER_SITE_OTHER): Payer: BC Managed Care – PPO

## 2021-12-01 DIAGNOSIS — L603 Nail dystrophy: Secondary | ICD-10-CM

## 2021-12-01 DIAGNOSIS — B351 Tinea unguium: Secondary | ICD-10-CM

## 2021-12-01 NOTE — Progress Notes (Signed)
Patient presents today for the 2nd laser treatment. Diagnosed with onychodystrophy by Dr. Posey Pronto.    Toenail most affected bilateral hallux.   All other systems are negative.   Nails were filed thin. Laser therapy was administered to bilateral hallux and patient tolerated the treatment well. All safety precautions were in place.    Follow up in 4 weeks for laser # 3.

## 2021-12-05 DIAGNOSIS — M6281 Muscle weakness (generalized): Secondary | ICD-10-CM | POA: Diagnosis not present

## 2021-12-05 DIAGNOSIS — M25562 Pain in left knee: Secondary | ICD-10-CM | POA: Diagnosis not present

## 2021-12-05 DIAGNOSIS — I8312 Varicose veins of left lower extremity with inflammation: Secondary | ICD-10-CM | POA: Diagnosis not present

## 2021-12-05 DIAGNOSIS — M25561 Pain in right knee: Secondary | ICD-10-CM | POA: Diagnosis not present

## 2021-12-07 DIAGNOSIS — M25561 Pain in right knee: Secondary | ICD-10-CM | POA: Diagnosis not present

## 2021-12-07 DIAGNOSIS — M6281 Muscle weakness (generalized): Secondary | ICD-10-CM | POA: Diagnosis not present

## 2021-12-07 DIAGNOSIS — M25562 Pain in left knee: Secondary | ICD-10-CM | POA: Diagnosis not present

## 2021-12-13 NOTE — Progress Notes (Signed)
[name]: [phone] Subjective:    I'm seeing this patient by the request  of:  [name], MD  CC: Right knee pain follow-up  DZH:[id number]  10/10/2021 Patient has made some improvement after the PRP at this moment.  We discussed that we could potentially repeat if necessary.  Following up with another provider to get gel injections which I think is a good attempt to try to help improve patient's pain.  Follow-up with me on an as-needed basis otherwise.   Update 12/19/2021 Mercedes [name] is a 61 y.o. female coming in with complaint of B knee pain. Patient states that she was doing a lot better than the last visit. Patient got the trivisc from Dr. Ninfa Linden and PT for two months so she is feeling better. Wants to continue gel injections if needed from our office. Patient is back to doing all her normal daily life things and her right knee is doing well but the left knee feels "funky"      Past Medical History:  Diagnosis Date   Allergy    Anemia    Glaucoma    sees Dr. [name]    Past Surgical History:  Procedure Laterality Date   BREAST BIOPSY Left 2017   benign   COLONOSCOPY  02/25/2018   per Dr. [name], no polyps, repeat in 10 yrs    dental implant  2013   KNEE ARTHROSCOPY  2004   ACL Repair   MOUTH SURGERY     2 weeks ago- has a retainer in, off antibiotics   OOPHORECTOMY  1996   left   Social History   Socioeconomic History   Marital status: Married    Spouse name: Not on file   Number of children: Not on file   Years of education: Not on file   Highest education level: Bachelor's degree (e.g., BA, AB, BS)  Occupational History   Not on file  Tobacco Use   Smoking status: Never   Smokeless tobacco: Never  Substance and Sexual Activity   Alcohol use: Yes    Alcohol/week: 3.0 standard drinks of alcohol    Types: 3 Standard drinks or equivalent per week    Comment:  occ   Drug use: No   Sexual activity: Not on file  Other Topics Concern   Not on file  Social History Narrative   Not on file   Social Determinants of Health   Financial Resource Strain: Low Risk  (07/16/2021)   Overall Financial Resource Strain (CARDIA)    Difficulty of Paying Living Expenses: Not hard at all  Food Insecurity: No Food Insecurity (07/16/2021)   Hunger Vital Sign    Worried About Running Out of Food in the Last Year: Never true    Ran Out of Food in the Last Year: Never true  Transportation Needs: No Transportation Needs (07/16/2021)   PRAPARE - Hydrologist (Medical): No    Lack of Transportation (Non-Medical): No  Physical Activity: Sufficiently Active (07/16/2021)   Exercise Vital Sign    Days of Exercise per Week: 5 days    Minutes of Exercise per Session: 90 min  Stress: No Stress Concern Present (07/16/2021)   [place]    Feeling of Stress : Not at all  Social Connections: [place] (07/16/2021)   Social Connection and Isolation Panel [NHANES]  Frequency of Communication with Friends and Family: More than three times a week    Frequency of Social Gatherings with Friends and Family: More than three times a week    Attends Religious Services: More than 4 times per year    Active Member of [employer] or Organizations: Yes    Attends Music therapist: Not on file    Marital Status: Married   Allergies  Allergen Reactions   Penicillins     Per pt: allergic as child   Sulfamethoxazole-Trimethoprim Hives   Family History  Problem Relation Age of Onset   Alcohol abuse Other    Asthma Other    Hyperlipidemia Other    Hypertension Other    Heart disease Other    Breast cancer Maternal Aunt    Colon cancer Neg Hx    Stomach cancer Neg Hx    Esophageal cancer Neg Hx    Rectal cancer Neg Hx     Current Outpatient Medications (Endocrine & Metabolic):     Estradiol (DIVIGEL) 0.25 MG/0.25GM GEL, Divigel 1 mg/gram (0.1 %) transdermal gel packet  USE DAILY AS DIRECTED   progesterone (PROMETRIUM) 100 MG capsule, Take 100 mg by mouth at bedtime.   Current Outpatient Medications (Respiratory):    cetirizine (ZYRTEC) 10 MG tablet, Take by mouth.    Current Outpatient Medications (Other):    diclofenac Sodium (VOLTAREN ARTHRITIS PAIN) 1 % GEL, Apply 4 g topically 4 (four) times daily. (Patient taking differently: Apply 4 g topically as needed.)   latanoprost (XALATAN) 0.005 % ophthalmic solution, Place 1 drop into both eyes at bedtime.   Multiple Vitamins-Minerals (CENTRUM SILVER ULTRA WOMENS PO), Take by mouth daily.   tiZANidine (ZANAFLEX) 4 MG tablet, TAKE 1 TABLET BY MOUTH EVERYDAY AT BEDTIME   terbinafine (LAMISIL) 250 MG tablet, Take 1 tablet (250 mg total) by mouth daily. (Patient not taking: Reported on 08/29/2021)    Objective  Blood pressure 110/80, pulse 70, height '5\' 3"'$  (1.6 m), SpO2 99 %.   General: No apparent distress alert and oriented x3 mood and affect normal, dressed appropriately.  HEENT: Pupils equal, extraocular movements intact  Respiratory: Patient's speak in full sentences and does not appear short of breath  Cardiovascular: No lower extremity edema, non tender, no erythema   Significant improvement in range of motion.  Full extension and flexion noted.  Patient does have some mild tenderness but nothing severe.  No significant instability noted today.    Impression and Recommendations:     The above documentation has been reviewed and is accurate and complete [name], DO

## 2021-12-19 ENCOUNTER — Ambulatory Visit (INDEPENDENT_AMBULATORY_CARE_PROVIDER_SITE_OTHER): Payer: BC Managed Care – PPO | Admitting: Family Medicine

## 2021-12-19 VITALS — BP 110/80 | HR 70 | Ht 63.0 in

## 2021-12-19 DIAGNOSIS — M170 Bilateral primary osteoarthritis of knee: Secondary | ICD-10-CM | POA: Diagnosis not present

## 2021-12-19 NOTE — Assessment & Plan Note (Signed)
Patient is doing much better at this time.  Hold on any other treatment options.  Can do PRP if necessary.  Follow-up with me on an as-needed basis

## 2022-01-02 ENCOUNTER — Other Ambulatory Visit: Payer: BC Managed Care – PPO

## 2022-01-03 ENCOUNTER — Ambulatory Visit (INDEPENDENT_AMBULATORY_CARE_PROVIDER_SITE_OTHER): Payer: BC Managed Care – PPO

## 2022-01-03 DIAGNOSIS — L603 Nail dystrophy: Secondary | ICD-10-CM

## 2022-01-03 NOTE — Progress Notes (Signed)
Patient presents today for the 3rd laser treatment. Diagnosed with onychodystrophy by Dr. Posey Pronto.    Toenail most affected bilateral hallux.   All other systems are negative.   Nails were filed thin. Laser therapy was administered to bilateral hallux and patient tolerated the treatment well. All safety precautions were in place.    Follow up in 6 weeks for laser # 4.

## 2022-01-23 DIAGNOSIS — H40053 Ocular hypertension, bilateral: Secondary | ICD-10-CM | POA: Diagnosis not present

## 2022-01-23 DIAGNOSIS — H524 Presbyopia: Secondary | ICD-10-CM | POA: Diagnosis not present

## 2022-02-03 ENCOUNTER — Other Ambulatory Visit: Payer: Self-pay | Admitting: Podiatry

## 2022-02-16 ENCOUNTER — Other Ambulatory Visit: Payer: BC Managed Care – PPO

## 2022-03-20 ENCOUNTER — Ambulatory Visit (INDEPENDENT_AMBULATORY_CARE_PROVIDER_SITE_OTHER): Payer: BC Managed Care – PPO | Admitting: Family Medicine

## 2022-03-20 ENCOUNTER — Encounter: Payer: Self-pay | Admitting: Family Medicine

## 2022-03-20 VITALS — BP 118/82 | HR 64 | Ht 63.0 in | Wt 153.0 lb

## 2022-03-20 DIAGNOSIS — M170 Bilateral primary osteoarthritis of knee: Secondary | ICD-10-CM | POA: Diagnosis not present

## 2022-03-20 NOTE — Assessment & Plan Note (Signed)
Discussed with patient again at great length.  We did make a plan today after deciding on all the other different changes that could be done.  We discussed which activities to do and which ones to avoid.  We discussed that I do think patient could do well with steroid injections or potentially repeating the PRP.  Patient would like to repeat the PRP when she is on her long drives in August.  Will set that up at a later date.  Will call us if needs anything sooner.  Total time reviewing patient's chart and discussing with patient 31 minutes

## 2022-03-20 NOTE — Patient Instructions (Signed)
Glad you are doing well Have appt in 2 months Set up PRP 10 days before trip in August

## 2022-03-20 NOTE — Progress Notes (Signed)
[place] Phone: [phone] Subjective:   Mercedes [name], am serving as a scribe for Dr. Hulan Saas.  I'm seeing this patient by the request  of:  [name], MD  CC: Bilateral knee pain  [id number]  12/19/2021 Patient is doing much better at this time.  Hold on any other treatment options.  Can do PRP if necessary.  Follow-up with me on an as-needed basis     Updated 03/20/2022 Mercedes [name] is a 62 y.o. female coming in with complaint of bilateral knee pain. Patient states that she gets sore after workouts or dancing. If she does yoga the following days, her pain will go away.   Feels like her L leg is weak. Considering getting PRP in L knee in August prior to her RV trip across the country.      Past Medical History:  Diagnosis Date   Allergy    Anemia    Glaucoma    sees Dr. [name]    Past Surgical History:  Procedure Laterality Date   BREAST BIOPSY Left 2017   benign   COLONOSCOPY  02/25/2018   per Dr. [name], [name] polyps, repeat in 10 yrs    dental implant  2013   KNEE ARTHROSCOPY  2004   ACL Repair   MOUTH SURGERY     2 weeks ago- has a retainer in, off antibiotics   OOPHORECTOMY  1996   left   Social History   Socioeconomic History   Marital status: Married    Spouse name: Not on file   Number of children: Not on file   Years of education: Not on file   Highest education level: Bachelor's degree (e.g., BA, AB, BS)  Occupational History   Not on file  Tobacco Use   Smoking status: Never   Smokeless tobacco: Never  Substance and Sexual Activity   Alcohol use: Yes    Alcohol/week: 3.0 standard drinks of alcohol    Types: 3 Standard drinks or equivalent per week    Comment: occ   Drug use: [name]   Sexual activity: Not on file  Other Topics Concern   Not on file  Social History Narrative   Not on file   Social Determinants of Health   Financial Resource  Strain: Low Risk  (07/16/2021)   Overall Financial Resource Strain (CARDIA)    Difficulty of Paying Living Expenses: Not hard at all  Food Insecurity: [name] Food Insecurity (07/16/2021)   Hunger Vital Sign    Worried About Running Out of Food in the Last Year: Never true    Ran Out of Food in the Last Year: Never true  Transportation Needs: [name] Transportation Needs (07/16/2021)   PRAPARE - Hydrologist (Medical): [name]    Lack of Transportation (Non-Medical): [name]  Physical Activity: Sufficiently Active (07/16/2021)   Exercise Vital Sign    Days of Exercise per Week: 5 days    Minutes of Exercise per Session: 90 min  Stress: [name] Stress Concern Present (07/16/2021)   [place]    Feeling of Stress : Not at all  Social Connections: [place] (07/16/2021)   Social Connection and Isolation Panel [NHANES]    Frequency of Communication with Friends and Family: More than three times a week    Frequency of Social Gatherings with Friends and Family: More than three times a  week    Attends Religious Services: More than 4 times per year    Active Member of Clubs or Organizations: Yes    Attends Archivist Meetings: Not on file    Marital Status: Married   Allergies  Allergen Reactions   Penicillins     Per pt: allergic as child   Sulfamethoxazole-Trimethoprim Hives   Family History  Problem Relation Age of Onset   Alcohol abuse Other    Asthma Other    Hyperlipidemia Other    Hypertension Other    Heart disease Other    Breast cancer Maternal Aunt    Colon cancer Neg Hx    Stomach cancer Neg Hx    Esophageal cancer Neg Hx    Rectal cancer Neg Hx     Current Outpatient Medications (Endocrine & Metabolic):    Estradiol (DIVIGEL) 0.25 MG/0.25GM GEL, Divigel 1 mg/gram (0.1 %) transdermal gel packet  USE DAILY AS DIRECTED   progesterone (PROMETRIUM) 100 MG capsule, Take 100 mg by mouth at  bedtime.   Current Outpatient Medications (Respiratory):    cetirizine (ZYRTEC) 10 MG tablet, Take by mouth.    Current Outpatient Medications (Other):    diclofenac Sodium (VOLTAREN ARTHRITIS PAIN) 1 % GEL, Apply 4 g topically 4 (four) times daily. (Patient taking differently: Apply 4 g topically as needed.)   latanoprost (XALATAN) 0.005 % ophthalmic solution, Place 1 drop into both eyes at bedtime.   Multiple Vitamins-Minerals (CENTRUM SILVER ULTRA WOMENS PO), Take by mouth daily.   tiZANidine (ZANAFLEX) 4 MG tablet, TAKE 1 TABLET BY MOUTH EVERYDAY AT BEDTIME   terbinafine (LAMISIL) 250 MG tablet, Take 1 tablet (250 mg total) by mouth daily. (Patient not taking: Reported on 08/29/2021)   Reviewed prior external information including notes and imaging from  primary care provider As well as notes that were available from care everywhere and other healthcare systems.  Past medical history, social, surgical and family history all reviewed in electronic medical record.  [name] pertanent information unless stated regarding to the chief complaint.   Review of Systems:  [name] headache, visual changes, nausea, vomiting, diarrhea, constipation, dizziness, abdominal pain, skin rash, fevers, chills, night sweats, weight loss, swollen lymph nodes, body aches, joint swelling, chest pain, shortness of breath, mood changes. POSITIVE muscle aches  Objective  Blood pressure 118/82, pulse 64, height '5\' 3"'$  (1.6 m), weight 153 lb (69.4 kg), SpO2 97 %.   General: [name] apparent distress alert and oriented x3 mood and affect normal, dressed appropriately.  HEENT: Pupils equal, extraocular movements intact  Respiratory: Patient's speak in full sentences and does not appear short of breath  Cardiovascular: [name] lower extremity edema, non tender, [name] erythema  Bilateral knees do not have any significant swelling at the moment and does have full range of motion.  Patient does have some mild crepitus noted.  Some mild  tenderness noted over the medial joint space right greater than left.    Impression and Recommendations:    The above documentation has been reviewed and is accurate and complete [name], DO

## 2022-04-06 DIAGNOSIS — Z01419 Encounter for gynecological examination (general) (routine) without abnormal findings: Secondary | ICD-10-CM | POA: Diagnosis not present

## 2022-04-06 DIAGNOSIS — Z1231 Encounter for screening mammogram for malignant neoplasm of breast: Secondary | ICD-10-CM | POA: Diagnosis not present

## 2022-04-06 DIAGNOSIS — Z6826 Body mass index (BMI) 26.0-26.9, adult: Secondary | ICD-10-CM | POA: Diagnosis not present

## 2022-04-06 DIAGNOSIS — Z124 Encounter for screening for malignant neoplasm of cervix: Secondary | ICD-10-CM | POA: Diagnosis not present

## 2022-04-25 NOTE — Progress Notes (Signed)
[place] Phone: [phone] Subjective:    I'm seeing this patient by the request  of:  [name], MD  CC: Bilateral knee pain  [id number]  03/20/2022 Discussed with patient again at great length. We did make a plan today after deciding on all the other different changes that could be done. We discussed which activities to do and which ones to avoid. We discussed that I do think patient could do well with steroid injections or potentially repeating the PRP. Patient would like to repeat the PRP when she is on her long drives in August. Will set that up at a later date. Will call us if needs anything sooner. Total time reviewing patient's chart and discussing with patient 31 minutes   Updated 04/26/2022 Mercedes [name] is a 62 y.o. female coming in with complaint of B knee pain that she would like bilateral CSI, she has increased her activity , taking Advil every hours at night         Past Medical History:  Diagnosis Date   Allergy    Anemia    Glaucoma    sees Dr. [name]    Past Surgical History:  Procedure Laterality Date   BREAST BIOPSY Left 2017   benign   COLONOSCOPY  02/25/2018   per Dr. [name], no polyps, repeat in 10 yrs    dental implant  2013   KNEE ARTHROSCOPY  2004   ACL Repair   MOUTH SURGERY     2 weeks ago- has a retainer in, off antibiotics   OOPHORECTOMY  1996   left   Social History   Socioeconomic History   Marital status: Married    Spouse name: Not on file   Number of children: Not on file   Years of education: Not on file   Highest education level: Bachelor's degree (e.g., BA, AB, BS)  Occupational History   Not on file  Tobacco Use   Smoking status: Never   Smokeless tobacco: Never  Substance and Sexual Activity   Alcohol use: Yes    Alcohol/week: 3.0 standard drinks of alcohol    Types: 3 Standard drinks or equivalent per week    Comment: occ    Drug use: No   Sexual activity: Not on file  Other Topics Concern   Not on file  Social History Narrative   Not on file   Social Determinants of Health   Financial Resource Strain: Low Risk  (07/16/2021)   Overall Financial Resource Strain (CARDIA)    Difficulty of Paying Living Expenses: Not hard at all  Food Insecurity: No Food Insecurity (07/16/2021)   Hunger Vital Sign    Worried About Running Out of Food in the Last Year: Never true    Ran Out of Food in the Last Year: Never true  Transportation Needs: No Transportation Needs (07/16/2021)   PRAPARE - Hydrologist (Medical): No    Lack of Transportation (Non-Medical): No  Physical Activity: Sufficiently Active (07/16/2021)   Exercise Vital Sign    Days of Exercise per Week: 5 days    Minutes of Exercise per Session: 90 min  Stress: No Stress Concern Present (07/16/2021)   [place]    Feeling of Stress : Not at all  Social Connections: [place] (07/16/2021)   Social Connection and Isolation Panel [NHANES]    Frequency of  Communication with Friends and Family: More than three times a week    Frequency of Social Gatherings with Friends and Family: More than three times a week    Attends Religious Services: More than 4 times per year    Active Member of [employer] or Organizations: Yes    Attends Music therapist: Not on file    Marital Status: Married   Allergies  Allergen Reactions   Penicillins     Per pt: allergic as child   Sulfamethoxazole-Trimethoprim Hives   Family History  Problem Relation Age of Onset   Alcohol abuse Other    Asthma Other    Hyperlipidemia Other    Hypertension Other    Heart disease Other    Breast cancer Maternal Aunt    Colon cancer Neg Hx    Stomach cancer Neg Hx    Esophageal cancer Neg Hx    Rectal cancer Neg Hx     Current Outpatient Medications (Endocrine & Metabolic):     Estradiol (DIVIGEL) 0.25 MG/0.25GM GEL, Divigel 1 mg/gram (0.1 %) transdermal gel packet  USE DAILY AS DIRECTED   progesterone (PROMETRIUM) 100 MG capsule, Take 100 mg by mouth at bedtime.   Current Outpatient Medications (Respiratory):    cetirizine (ZYRTEC) 10 MG tablet, Take by mouth.    Current Outpatient Medications (Other):    diclofenac Sodium (VOLTAREN ARTHRITIS PAIN) 1 % GEL, Apply 4 g topically 4 (four) times daily. (Patient taking differently: Apply 4 g topically as needed.)   latanoprost (XALATAN) 0.005 % ophthalmic solution, Place 1 drop into both eyes at bedtime.   Multiple Vitamins-Minerals (CENTRUM SILVER ULTRA WOMENS PO), Take by mouth daily.   tiZANidine (ZANAFLEX) 4 MG tablet, TAKE 1 TABLET BY MOUTH EVERYDAY AT BEDTIME   terbinafine (LAMISIL) 250 MG tablet, Take 1 tablet (250 mg total) by mouth daily. (Patient not taking: Reported on 08/29/2021)   Reviewed prior external information including notes and imaging from  primary care provider As well as notes that were available from care everywhere and other healthcare systems.  Past medical history, social, surgical and family history all reviewed in electronic medical record.  No pertanent information unless stated regarding to the chief complaint.   Review of Systems:  No headache, visual changes, nausea, vomiting, diarrhea, constipation, dizziness, abdominal pain, skin rash, fevers, chills, night sweats, weight loss, swollen lymph nodes, body aches,  chest pain, shortness of breath, mood changes. POSITIVE muscle aches, joint swelling  Objective  Blood pressure 118/78, pulse 69, height 5\' 3"  (1.6 m), weight 151 lb (68.5 kg), SpO2 98 %.   General: No apparent distress alert and oriented x3 mood and affect normal, dressed appropriately.  HEENT: Pupils equal, extraocular movements intact  Respiratory: Patient's speak in full sentences and does not appear short of breath  Cardiovascular: No lower extremity edema, non  tender, no erythema  Bilateral knee exam shows patient does have some crepitus of the knees bilaterally.  Trace effusion in the knees bilaterally.  Tenderness to palpation.  After informed written and verbal consent, patient was seated on exam table. Right knee was prepped with alcohol swab and utilizing anterolateral approach, patient's right knee space was injected with 4:1  marcaine 0.5%: Kenalog 40mg /dL. Patient tolerated the procedure well without immediate complications.  After informed written and verbal consent, patient was seated on exam table. Left knee was prepped with alcohol swab and utilizing anterolateral approach, patient's left knee space was injected with 4:1  marcaine 0.5%: Kenalog 40mg /dL. Patient tolerated  the procedure well without immediate complications.    Impression and Recommendations:    The above documentation has been reviewed and is accurate and complete [name], DO

## 2022-04-26 ENCOUNTER — Ambulatory Visit (INDEPENDENT_AMBULATORY_CARE_PROVIDER_SITE_OTHER): Payer: BC Managed Care – PPO | Admitting: Family Medicine

## 2022-04-26 VITALS — BP 118/78 | HR 69 | Ht 63.0 in | Wt 151.0 lb

## 2022-04-26 DIAGNOSIS — M170 Bilateral primary osteoarthritis of knee: Secondary | ICD-10-CM | POA: Diagnosis not present

## 2022-04-26 NOTE — Patient Instructions (Addendum)
Good to see you Set up PRP in may and we can always move it

## 2022-04-27 ENCOUNTER — Encounter: Payer: Self-pay | Admitting: Family Medicine

## 2022-04-27 NOTE — Assessment & Plan Note (Signed)
Bilateral injections given today, tolerated the procedure well, discussed with patient that there are different treatment options and patient will consider the possibility of PRP when she will have more time to do the post PRP protocol.  Still wanting to avoid any type of surgical intervention if possible.  Increase activity slowly otherwise.  Follow-up with me again 6 to 8 weeks.  Chronic problem with worsening symptoms.  Did discuss anti-inflammatories as well

## 2022-04-29 ENCOUNTER — Encounter: Payer: Self-pay | Admitting: Family Medicine

## 2022-05-02 ENCOUNTER — Other Ambulatory Visit: Payer: Self-pay

## 2022-05-18 ENCOUNTER — Ambulatory Visit: Payer: BC Managed Care – PPO | Admitting: Family Medicine

## 2022-05-28 ENCOUNTER — Encounter: Payer: Self-pay | Admitting: Family Medicine

## 2022-05-28 ENCOUNTER — Ambulatory Visit (INDEPENDENT_AMBULATORY_CARE_PROVIDER_SITE_OTHER): Payer: BC Managed Care – PPO | Admitting: Family Medicine

## 2022-05-28 VITALS — BP 124/68 | HR 68 | Temp 98.5°F | Wt 151.6 lb

## 2022-05-28 DIAGNOSIS — M170 Bilateral primary osteoarthritis of knee: Secondary | ICD-10-CM | POA: Diagnosis not present

## 2022-05-28 MED ORDER — MELOXICAM 15 MG PO TABS: 15.0000 mg | ORAL_TABLET | Freq: Every day | ORAL | 3 refills | Status: DC

## 2022-05-28 NOTE — Progress Notes (Signed)
   Subjective:    Patient ID: Mercedes [name], female    DOB: [birth year], 62 y.o.   MRN: [id number]  HPI Here to ask for help with bilateral knee pain. She has OA in both knees, the right one being the worst. She saw Dr. Rayburn Ma whoffered to do replacement surgery, but she was not ready for that yet. She has been seeing Dr. Terrilee Files, and she has had several injections of PRP with mixed success. She is taking Ibuprofen several times a day, but she knows this is hard on her body.    Review of Systems  Constitutional: Negative.   Respiratory: Negative.    Cardiovascular: Negative.   Musculoskeletal:  Positive for arthralgias.       Objective:   Physical Exam Constitutional:      General: She is not in acute distress.    Appearance: Normal appearance.  Cardiovascular:     Rate and Rhythm: Normal rate and regular rhythm.     Pulses: Normal pulses.     Heart sounds: Normal heart sounds.  Pulmonary:     Effort: Pulmonary effort is normal.     Breath sounds: Normal breath sounds.  Neurological:     Mental Status: She is alert.           Assessment & Plan:  Bilateral knee pain due to OA. She will stop the Ibuprofen and instead take Meloxicam 15 mg daily. I also also suggested she apply Voltaren gel up to 4 times a day as needed, and she can wear elastic support sleeves.  Mercedes Crane, MD

## 2022-06-14 NOTE — Progress Notes (Signed)
[name] [address] [place] [address] Phone: [phone] Subjective:   Mercedes [name], am serving as a scribe for Dr. Antoine Primas.  I'm seeing this patient by the request  of:  Nelwyn Salisbury, MD  CC: Bilateral knee pain  [id number]  Mercedes [name] is a 62 y.o. female coming in with complaint of B knee pain. Patient states here for B injection in knees. Will do PRP in August     Past Medical History:  Diagnosis Date   Allergy    Anemia    Glaucoma    sees Dr. Sinda Du    Past Surgical History:  Procedure Laterality Date   BREAST BIOPSY Left 2017   benign   COLONOSCOPY  02/25/2018   per Dr. Leone Payor, no polyps, repeat in 10 yrs    dental implant  2013   KNEE ARTHROSCOPY  2004   ACL Repair   MOUTH SURGERY     2 weeks ago- has a retainer in, off antibiotics   OOPHORECTOMY  1996   left   Social History   Socioeconomic History   Marital status: Married    Spouse name: Not on file   Number of children: Not on file   Years of education: Not on file   Highest education level: Bachelor's degree (e.g., BA, AB, BS)  Occupational History   Not on file  Tobacco Use   Smoking status: Never   Smokeless tobacco: Never  Substance and Sexual Activity   Alcohol use: Yes    Alcohol/week: 3.0 standard drinks of alcohol    Types: 3 Standard drinks or equivalent per week    Comment: occ   Drug use: No   Sexual activity: Not on file  Other Topics Concern   Not on file  Social History Narrative   Not on file   Social Determinants of Health   Financial Resource Strain: Low Risk  (07/16/2021)   Overall Financial Resource Strain (CARDIA)    Difficulty of Paying Living Expenses: Not hard at all  Food Insecurity: No Food Insecurity (07/16/2021)   Hunger Vital Sign    Worried About Running Out of Food in the Last Year: Never true    Ran Out of Food in the Last Year: Never true  Transportation Needs: No Transportation Needs  (07/16/2021)   PRAPARE - Administrator, Civil Service (Medical): No    Lack of Transportation (Non-Medical): No  Physical Activity: Sufficiently Active (07/16/2021)   Exercise Vital Sign    Days of Exercise per Week: 5 days    Minutes of Exercise per Session: 90 min  Stress: No Stress Concern Present (07/16/2021)   [employer] of Occupational Health - Occupational Stress Questionnaire    Feeling of Stress : Not at all  Social Connections: [place] (07/16/2021)   Social Connection and Isolation Panel [NHANES]    Frequency of Communication with Friends and Family: More than three times a week    Frequency of Social Gatherings with Friends and Family: More than three times a week    Attends Religious Services: More than 4 times per year    Active Member of [employer] or Organizations: Yes    Attends Banker Meetings: Not on file    Marital Status: Married   Allergies  Allergen Reactions   Penicillins     Per pt: allergic as child   Sulfamethoxazole-Trimethoprim Hives   Family History  Problem Relation Age of  Onset   Alcohol abuse Other    Asthma Other    Hyperlipidemia Other    Hypertension Other    Heart disease Other    Breast cancer Maternal Aunt    Colon cancer Neg Hx    Stomach cancer Neg Hx    Esophageal cancer Neg Hx    Rectal cancer Neg Hx     Current Outpatient Medications (Endocrine & Metabolic):    Estradiol (DIVIGEL) 0.25 MG/0.25GM GEL, Divigel 1 mg/gram (0.1 %) transdermal gel packet  USE DAILY AS DIRECTED   progesterone (PROMETRIUM) 100 MG capsule, Take 100 mg by mouth at bedtime.   Current Outpatient Medications (Respiratory):    cetirizine (ZYRTEC) 10 MG tablet, Take by mouth.  Current Outpatient Medications (Analgesics):    meloxicam (MOBIC) 15 MG tablet, Take 1 tablet (15 mg total) by mouth daily.   Current Outpatient Medications (Other):    latanoprost (XALATAN) 0.005 % ophthalmic solution, Place 1 drop into both eyes  at bedtime.   Multiple Vitamins-Minerals (CENTRUM SILVER ULTRA WOMENS PO), Take by mouth daily.   Reviewed prior external information including notes and imaging from  primary care provider As well as notes that were available from care everywhere and other healthcare systems.  Past medical history, social, surgical and family history all reviewed in electronic medical record.  No pertanent information unless stated regarding to the chief complaint.   Review of Systems:  No headache, visual changes, nausea, vomiting, diarrhea, constipation, dizziness, abdominal pain, skin rash, fevers, chills, night sweats, weight loss, swollen lymph nodes, body aches, chest pain, shortness of breath, mood changes. POSITIVE muscle aches, joint swelling   Objective  Blood pressure 130/84, pulse 74, height 5\' 3"  (1.6 m), weight 151 lb (68.5 kg), SpO2 97 %.   General: No apparent distress alert and oriented x3 mood and affect normal, dressed appropriately.  HEENT: Pupils equal, extraocular movements intact  Respiratory: Patient's speak in full sentences and does not appear short of breath  Cardiovascular: No lower extremity edema, non tender, no erythema  Antalgic gait noted.  Patient does have a trace effusion with noted  Patient does have some instability of the knee bilaterally.  After informed written and verbal consent, patient was seated on exam table. Right knee was prepped with alcohol swab and utilizing anterolateral approach, patient's right knee space was injected with 4:1  marcaine 0.5%: Kenalog 40mg /dL. Patient tolerated the procedure well without immediate complications.  After informed written and verbal consent, patient was seated on exam table. Left knee was prepped with alcohol swab and utilizing anterolateral approach, patient's left knee space was injected with 4:1  marcaine 0.5%: Kenalog 40mg /dL. Patient tolerated the procedure well without immediate complications.    Impression and  Recommendations:    The above documentation has been reviewed and is accurate and complete Judi Saa, DO

## 2022-06-15 ENCOUNTER — Ambulatory Visit (INDEPENDENT_AMBULATORY_CARE_PROVIDER_SITE_OTHER): Payer: BC Managed Care – PPO | Admitting: Family Medicine

## 2022-06-15 VITALS — BP 130/84 | HR 74 | Ht 63.0 in | Wt 151.0 lb

## 2022-06-15 DIAGNOSIS — M170 Bilateral primary osteoarthritis of knee: Secondary | ICD-10-CM

## 2022-06-15 NOTE — Patient Instructions (Addendum)
Good to see you! See you in August

## 2022-06-15 NOTE — Assessment & Plan Note (Signed)
Chronic problem with exacerbation and worsening symptoms.  Has a lot of traveling patient is doing over the course of time.  Patient is going to be out of town for the next several weeks.  Discussed which activities to do and which ones to avoid.  Increase activity slowly.  Discussed posture and ergonomics.  Follow-up again in 6- 8 weeks

## 2022-07-30 ENCOUNTER — Encounter: Payer: Self-pay | Admitting: Family Medicine

## 2022-08-09 DIAGNOSIS — H401131 Primary open-angle glaucoma, bilateral, mild stage: Secondary | ICD-10-CM | POA: Diagnosis not present

## 2022-08-29 NOTE — Progress Notes (Signed)
[name] [address] [place] [address] Phone: [phone] Subjective:   Mercedes [name], am serving as a scribe for Dr. Antoine Primas.  I'm seeing this patient by the request  of:  Nelwyn Salisbury, MD  CC: Bilateral knee pain  [id number]  06/15/2022 Chronic problem with exacerbation and worsening symptoms.  Has a lot of traveling patient is doing over the course of time.  Patient is going to be out of town for the next several weeks.  Discussed which activities to do and which ones to avoid.  Increase activity slowly.  Discussed posture and ergonomics.  Follow-up again in 6- 8 weeks     Updated 09/05/2022 Mercedes [name] is a 62 y.o. female coming in with complaint of B knee pain. Patient states that 6 months after the PRP she is having trouble walking as easily. Patient states the cortisone injection helped a lot and would like to get that done again. During the day she is okay but if she wakes up at night she cannot get back to bed because of the pain. States that she has a generalized aching.       Past Medical History:  Diagnosis Date   Allergy    Anemia    Glaucoma    sees Dr. Sinda Du    Past Surgical History:  Procedure Laterality Date   BREAST BIOPSY Left 2017   benign   COLONOSCOPY  02/25/2018   per Dr. Leone Payor, no polyps, repeat in 10 yrs    dental implant  2013   KNEE ARTHROSCOPY  2004   ACL Repair   MOUTH SURGERY     2 weeks ago- has a retainer in, off antibiotics   OOPHORECTOMY  1996   left   Social History   Socioeconomic History   Marital status: Married    Spouse name: Not on file   Number of children: Not on file   Years of education: Not on file   Highest education level: Bachelor's degree (e.g., BA, AB, BS)  Occupational History   Not on file  Tobacco Use   Smoking status: Never   Smokeless tobacco: Never  Substance and Sexual Activity   Alcohol use: Yes    Alcohol/week: 3.0 standard drinks  of alcohol    Types: 3 Standard drinks or equivalent per week    Comment: occ   Drug use: No   Sexual activity: Not on file  Other Topics Concern   Not on file  Social History Narrative   Not on file   Social Determinants of Health   Financial Resource Strain: Low Risk  (07/16/2021)   Overall Financial Resource Strain (CARDIA)    Difficulty of Paying Living Expenses: Not hard at all  Food Insecurity: No Food Insecurity (07/16/2021)   Hunger Vital Sign    Worried About Running Out of Food in the Last Year: Never true    Ran Out of Food in the Last Year: Never true  Transportation Needs: No Transportation Needs (07/16/2021)   PRAPARE - Administrator, Civil Service (Medical): No    Lack of Transportation (Non-Medical): No  Physical Activity: Sufficiently Active (07/16/2021)   Exercise Vital Sign    Days of Exercise per Week: 5 days    Minutes of Exercise per Session: 90 min  Stress: No Stress Concern Present (07/16/2021)   [employer] of Occupational Health - Occupational Stress Questionnaire    Feeling of Stress : Not  at all  Social Connections: Socially Integrated (07/16/2021)   Social Connection and Isolation Panel [NHANES]    Frequency of Communication with Friends and Family: More than three times a week    Frequency of Social Gatherings with Friends and Family: More than three times a week    Attends Religious Services: More than 4 times per year    Active Member of [employer] or Organizations: Yes    Attends Engineer, structural: Not on file    Marital Status: Married   Allergies  Allergen Reactions   Penicillins     Per pt: allergic as child   Sulfamethoxazole-Trimethoprim Hives   Family History  Problem Relation Age of Onset   Alcohol abuse Other    Asthma Other    Hyperlipidemia Other    Hypertension Other    Heart disease Other    Breast cancer Maternal Aunt    Colon cancer Neg Hx    Stomach cancer Neg Hx    Esophageal cancer Neg Hx    Rectal  cancer Neg Hx     Current Outpatient Medications (Endocrine & Metabolic):    Estradiol (DIVIGEL) 0.25 MG/0.25GM GEL, Divigel 1 mg/gram (0.1 %) transdermal gel packet  USE DAILY AS DIRECTED   progesterone (PROMETRIUM) 100 MG capsule, Take 100 mg by mouth at bedtime.   Current Outpatient Medications (Respiratory):    cetirizine (ZYRTEC) 10 MG tablet, Take by mouth.  Current Outpatient Medications (Analgesics):    meloxicam (MOBIC) 15 MG tablet, Take 1 tablet (15 mg total) by mouth daily.   Current Outpatient Medications (Other):    latanoprost (XALATAN) 0.005 % ophthalmic solution, Place 1 drop into both eyes at bedtime.   Multiple Vitamins-Minerals (CENTRUM SILVER ULTRA WOMENS PO), Take by mouth daily.   Reviewed prior external information including notes and imaging from  primary care provider As well as notes that were available from care everywhere and other healthcare systems.  Past medical history, social, surgical and family history all reviewed in electronic medical record.  No pertanent information unless stated regarding to the chief complaint.   Review of Systems:  No headache, visual changes, nausea, vomiting, diarrhea, constipation, dizziness, abdominal pain, skin rash, fevers, chills, night sweats, weight loss, swollen lymph nodes, body aches, joint swelling, chest pain, shortness of breath, mood changes. POSITIVE muscle aches  Objective  Blood pressure 122/70, pulse 79, height 5\' 3"  (1.6 m), weight 150 lb (68 kg), SpO2 98%.   General: No apparent distress alert and oriented x3 mood and affect normal, dressed appropriately.  HEENT: Pupils equal, extraocular movements intact  Respiratory: Patient's speak in full sentences and does not appear short of breath  Cardiovascular: No lower extremity edema, non tender, no erythema  Mild antalgic gait noted.  Crepitus of the knees bilaterally.  Seems to be mostly of the patellofemoral joint.  Does have a positive grind test  noted.  After informed written and verbal consent, patient was seated on exam table. Right knee was prepped with alcohol swab and utilizing anterolateral approach, patient's right knee space was injected with 4:1  marcaine 0.5%: Kenalog 40mg /dL. Patient tolerated the procedure well without immediate complications.  After informed written and verbal consent, patient was seated on exam table. Left knee was prepped with alcohol swab and utilizing anterolateral approach, patient's left knee space was injected with 4:1  marcaine 0.5%: Kenalog 40mg /dL. Patient tolerated the procedure well without immediate complications.   Impression and Recommendations:    The above documentation has been reviewed and  is accurate and complete Judi Saa, DO

## 2022-09-05 ENCOUNTER — Ambulatory Visit: Payer: BC Managed Care – PPO | Admitting: Family Medicine

## 2022-09-05 ENCOUNTER — Encounter: Payer: Self-pay | Admitting: Family Medicine

## 2022-09-05 VITALS — BP 122/70 | HR 79 | Ht 63.0 in | Wt 150.0 lb

## 2022-09-05 DIAGNOSIS — M170 Bilateral primary osteoarthritis of knee: Secondary | ICD-10-CM | POA: Diagnosis not present

## 2022-09-05 NOTE — Patient Instructions (Signed)
Good to see you Injections in both knees today  Follow up in 10-12 weeks

## 2022-09-05 NOTE — Assessment & Plan Note (Signed)
Chronic problem with worsening symptoms.  Still wants to avoid any type of surgical intervention.  Discussed icing regimen and home exercises.  Discussed avoiding certain activities.  Follow-up again in 10-12 weeks otherwise.  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

## 2022-09-21 ENCOUNTER — Ambulatory Visit: Payer: BC Managed Care – PPO | Admitting: Family Medicine

## 2022-11-06 DIAGNOSIS — I83892 Varicose veins of left lower extremities with other complications: Secondary | ICD-10-CM | POA: Diagnosis not present

## 2022-11-06 DIAGNOSIS — I872 Venous insufficiency (chronic) (peripheral): Secondary | ICD-10-CM | POA: Diagnosis not present

## 2022-11-13 NOTE — Progress Notes (Unsigned)
[name] [address] [place] [address] Phone: [phone] Subjective:   Mercedes [name], am serving as a scribe for Dr. Antoine Primas.  I'm seeing this patient by the request  of:  Nelwyn Salisbury, MD  CC: Bilateral knee pain has  [id number]  09/05/2022 Chronic problem with worsening symptoms. Still wants to avoid any type of surgical intervention. Discussed icing regimen and home exercises. Discussed avoiding certain activities. Follow-up again in 10-12 weeks otherwise. 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   Updated 11/14/2022 Mercedes [name] is a 62 y.o. female coming in with complaint of B knee pain denies Injections.  Patient states here for knee injections to maintain before getting TKR.       Past Medical History:  Diagnosis Date   Allergy    Anemia    Glaucoma    sees Dr. Sinda Du    Past Surgical History:  Procedure Laterality Date   BREAST BIOPSY Left 2017   benign   COLONOSCOPY  02/25/2018   per Dr. Leone Payor, no polyps, repeat in 10 yrs    dental implant  2013   KNEE ARTHROSCOPY  2004   ACL Repair   MOUTH SURGERY     2 weeks ago- has a retainer in, off antibiotics   OOPHORECTOMY  1996   left   Social History   Socioeconomic History   Marital status: Married    Spouse name: Not on file   Number of children: Not on file   Years of education: Not on file   Highest education level: Bachelor's degree (e.g., BA, AB, BS)  Occupational History   Not on file  Tobacco Use   Smoking status: Never   Smokeless tobacco: Never  Substance and Sexual Activity   Alcohol use: Yes    Alcohol/week: 3.0 standard drinks of alcohol    Types: 3 Standard drinks or equivalent per week    Comment: occ   Drug use: No   Sexual activity: Not on file  Other Topics Concern   Not on file  Social History Narrative   Not on file    Social Determinants of Health   Financial Resource Strain: Low Risk  (07/16/2021)   Overall Financial Resource Strain (CARDIA)    Difficulty of Paying Living Expenses: Not hard at all  Food Insecurity: No Food Insecurity (07/16/2021)   Hunger Vital Sign    Worried About Running Out of Food in the Last Year: Never true    Ran Out of Food in the Last Year: Never true  Transportation Needs: No Transportation Needs (07/16/2021)   PRAPARE - Administrator, Civil Service (Medical): No    Lack of Transportation (Non-Medical): No  Physical Activity: Sufficiently Active (07/16/2021)   Exercise Vital Sign    Days of Exercise per Week: 5 days    Minutes of Exercise per Session: 90 min  Stress: No Stress Concern Present (07/16/2021)   [employer] of Occupational Health - Occupational Stress Questionnaire    Feeling of Stress : Not at all  Social Connections: Socially Integrated (07/16/2021)   Social Connection and Isolation Panel [NHANES]    Frequency of Communication with Friends and Family: More than three times a week    Frequency of Social Gatherings with Friends and Family: More than three times a week    Attends Religious Services: More than  4 times per year    Active Member of Clubs or Organizations: Yes    Attends Banker Meetings: Not on file    Marital Status: Married   Allergies  Allergen Reactions   Penicillins     Per pt: allergic as child   Sulfamethoxazole-Trimethoprim Hives   Family History  Problem Relation Age of Onset   Alcohol abuse Other    Asthma Other    Hyperlipidemia Other    Hypertension Other    Heart disease Other    Breast cancer Maternal Aunt    Colon cancer Neg Hx    Stomach cancer Neg Hx    Esophageal cancer Neg Hx    Rectal cancer Neg Hx     Current Outpatient Medications (Endocrine & Metabolic):    Estradiol (DIVIGEL) 0.25 MG/0.25GM GEL, Divigel 1 mg/gram (0.1 %) transdermal gel packet  USE DAILY AS DIRECTED   progesterone  (PROMETRIUM) 100 MG capsule, Take 100 mg by mouth at bedtime.   Current Outpatient Medications (Respiratory):    cetirizine (ZYRTEC) 10 MG tablet, Take by mouth.  Current Outpatient Medications (Analgesics):    meloxicam (MOBIC) 15 MG tablet, Take 1 tablet (15 mg total) by mouth daily.   Current Outpatient Medications (Other):    latanoprost (XALATAN) 0.005 % ophthalmic solution, Place 1 drop into both eyes at bedtime.   Multiple Vitamins-Minerals (CENTRUM SILVER ULTRA WOMENS PO), Take by mouth daily.   Reviewed prior external information including notes and imaging from  primary care provider As well as notes that were available from care everywhere and other healthcare systems.  Past medical history, social, surgical and family history all reviewed in electronic medical record.  No pertanent information unless stated regarding to the chief complaint.   Review of Systems:  No headache, visual changes, nausea, vomiting, diarrhea, constipation, dizziness, abdominal pain, skin rash, fevers, chills, night sweats, weight loss, swollen lymph nodes, body aches, joint swelling, chest pain, shortness of breath, mood changes. POSITIVE muscle aches  Objective  Blood pressure 118/80, pulse 67, height 5\' 3"  (1.6 m), weight 152 lb (68.9 kg), SpO2 97%.   General: No apparent distress alert and oriented x3 mood and affect normal, dressed appropriately.  HEENT: Pupils equal, extraocular movements intact  Respiratory: Patient's speak in full sentences and does not appear short of breath  Cardiovascular: No lower extremity edema, non tender, no erythema  Knee exam does have some crepitus noted.  Patient does have some trace effusion noted of the patellofemoral  After informed written and verbal consent, patient was seated on exam table. Right knee was prepped with alcohol swab and utilizing anterolateral approach, patient's right knee space was injected with 4:1  marcaine 0.5%: Kenalog 40mg /dL.  Patient tolerated the procedure well without immediate complications.  After informed written and verbal consent, patient was seated on exam table. Left knee was prepped with alcohol swab and utilizing anterolateral approach, patient's left knee space was injected with 4:1  marcaine 0.5%: Kenalog 40mg /dL. Patient tolerated the procedure well without immediate complications.    Impression and Recommendations:     The above documentation has been reviewed and is accurate and complete Judi Saa, DO

## 2022-11-14 ENCOUNTER — Encounter: Payer: Self-pay | Admitting: Family Medicine

## 2022-11-14 ENCOUNTER — Ambulatory Visit (INDEPENDENT_AMBULATORY_CARE_PROVIDER_SITE_OTHER): Payer: BC Managed Care – PPO | Admitting: Family Medicine

## 2022-11-14 VITALS — BP 118/80 | HR 67 | Ht 63.0 in | Wt 152.0 lb

## 2022-11-14 DIAGNOSIS — M170 Bilateral primary osteoarthritis of knee: Secondary | ICD-10-CM | POA: Diagnosis not present

## 2022-11-14 NOTE — Patient Instructions (Signed)
Injection in knees?

## 2022-11-14 NOTE — Assessment & Plan Note (Addendum)
Chronic, with exacerbation.  Still holding melatonin surgical intervention if possible.  Patient thinks that she may need to have it on the near future though.  Discussed with patient that icing regimen and home exercises, which activities to do and which ones to avoid.  Increase activity slowly.  Discussed medications such as anti-inflammatories, topical and oral.  Meloxicam prescribed follow-up again in 10 weeks

## 2023-01-28 NOTE — Progress Notes (Signed)
[name] [address] [place] [address] Phone: [phone] Subjective:   Mercedes [name], am serving as a scribe for Dr. Antoine Primas.  I'm seeing this patient by the request  of:  Nelwyn Salisbury, MD  CC: Bilateral knee pain  [id number]  11/14/2022 Chronic, with exacerbation.  Still holding melatonin surgical intervention if possible.  Patient thinks that she may need to have it on the near future though.  Discussed with patient that icing regimen and home exercises, which activities to do and which ones to avoid.  Increase activity slowly.  Discussed medications such as anti-inflammatories, topical and oral.  Meloxicam prescribed follow-up again in 10 weeks   Updated 01/29/2023 Mercedes [name] is a 62 y.o. female coming in with complaint of B knee pain. Patient states that within the past 2 weeks the injections have started to wear off.        Past Medical History:  Diagnosis Date   Allergy    Anemia    Glaucoma    sees Dr. Sinda Du    Past Surgical History:  Procedure Laterality Date   BREAST BIOPSY Left 2017   benign   COLONOSCOPY  02/25/2018   per Dr. Leone Payor, no polyps, repeat in 10 yrs    dental implant  2013   KNEE ARTHROSCOPY  2004   ACL Repair   MOUTH SURGERY     2 weeks ago- has a retainer in, off antibiotics   OOPHORECTOMY  1996   left   Social History   Socioeconomic History   Marital status: Married    Spouse name: Not on file   Number of children: Not on file   Years of education: Not on file   Highest education level: Bachelor's degree (e.g., BA, AB, BS)  Occupational History   Not on file  Tobacco Use   Smoking status: Never   Smokeless tobacco: Never  Substance and Sexual Activity   Alcohol use: Yes    Alcohol/week: 3.0 standard drinks of alcohol    Types: 3 Standard drinks or equivalent per week    Comment: occ   Drug use: No   Sexual activity: Not on file  Other Topics Concern   Not  on file  Social History Narrative   Not on file   Social Drivers of Health   Financial Resource Strain: Low Risk  (07/16/2021)   Overall Financial Resource Strain (CARDIA)    Difficulty of Paying Living Expenses: Not hard at all  Food Insecurity: No Food Insecurity (07/16/2021)   Hunger Vital Sign    Worried About Running Out of Food in the Last Year: Never true    Ran Out of Food in the Last Year: Never true  Transportation Needs: No Transportation Needs (07/16/2021)   PRAPARE - Administrator, Civil Service (Medical): No    Lack of Transportation (Non-Medical): No  Physical Activity: Sufficiently Active (07/16/2021)   Exercise Vital Sign    Days of Exercise per Week: 5 days    Minutes of Exercise per Session: 90 min  Stress: No Stress Concern Present (07/16/2021)   [employer] of Occupational Health - Occupational Stress Questionnaire    Feeling of Stress : Not at all  Social Connections: Socially Integrated (07/16/2021)   Social Connection and Isolation Panel [NHANES]    Frequency of Communication with Friends and Family: More than three times a week    Frequency of Social Gatherings with Friends and Family:  More than three times a week    Attends Religious Services: More than 4 times per year    Active Member of Clubs or Organizations: Yes    Attends Banker Meetings: Not on file    Marital Status: Married   Allergies  Allergen Reactions   Penicillins     Per pt: allergic as child   Sulfamethoxazole-Trimethoprim Hives   Family History  Problem Relation Age of Onset   Alcohol abuse Other    Asthma Other    Hyperlipidemia Other    Hypertension Other    Heart disease Other    Breast cancer Maternal Aunt    Colon cancer Neg Hx    Stomach cancer Neg Hx    Esophageal cancer Neg Hx    Rectal cancer Neg Hx     Current Outpatient Medications (Endocrine & Metabolic):    Estradiol (DIVIGEL) 0.25 MG/0.25GM GEL, Divigel 1 mg/gram (0.1 %) transdermal gel  packet  USE DAILY AS DIRECTED   progesterone (PROMETRIUM) 100 MG capsule, Take 100 mg by mouth at bedtime.   Current Outpatient Medications (Respiratory):    cetirizine (ZYRTEC) 10 MG tablet, Take by mouth.  Current Outpatient Medications (Analgesics):    meloxicam (MOBIC) 15 MG tablet, Take 1 tablet (15 mg total) by mouth daily.   Current Outpatient Medications (Other):    latanoprost (XALATAN) 0.005 % ophthalmic solution, Place 1 drop into both eyes at bedtime.   Multiple Vitamins-Minerals (CENTRUM SILVER ULTRA WOMENS PO), Take by mouth daily.   Reviewed prior external information including notes and imaging from  primary care provider As well as notes that were available from care everywhere and other healthcare systems.  Past medical history, social, surgical and family history all reviewed in electronic medical record.  No pertanent information unless stated regarding to the chief complaint.   Review of Systems:  No headache, visual changes, nausea, vomiting, diarrhea, constipation, dizziness, abdominal pain, skin rash, fevers, chills, night sweats, weight loss, swollen lymph nodes, body aches, joint swelling, chest pain, shortness of breath, mood changes. POSITIVE muscle aches  Objective  Blood pressure 118/82, pulse 74, height 5\' 3"  (1.6 m), weight 147 lb (66.7 kg), SpO2 98%.   General: No apparent distress alert and oriented x3 mood and affect normal, dressed appropriately.  HEENT: Pupils equal, extraocular movements intact  Respiratory: Patient's speak in full sentences and does not appear short of breath  Cardiovascular: No lower extremity edema, non tender, no erythema  .  Bilateral knees do have some crepitus noted.  Seems to be more the patellofemoral area.  After informed written and verbal consent, patient was seated on exam table. Right knee was prepped with alcohol swab and utilizing anterolateral approach, patient's right knee space was injected with 4:1  marcaine  0.5%: Kenalog 40mg /dL. Patient tolerated the procedure well without immediate complications.  After informed written and verbal consent, patient was seated on exam table. Left knee was prepped with alcohol swab and utilizing anterolateral approach, patient's left knee space was injected with 4:1  marcaine 0.5%: Kenalog 40mg /dL. Patient tolerated the procedure well without immediate complications.    Impression and Recommendations:     The above documentation has been reviewed and is accurate and complete Judi Saa, DO

## 2023-01-29 ENCOUNTER — Ambulatory Visit: Payer: BC Managed Care – PPO | Admitting: Family Medicine

## 2023-01-29 VITALS — BP 118/82 | HR 74 | Ht 63.0 in | Wt 147.0 lb

## 2023-01-29 DIAGNOSIS — M170 Bilateral primary osteoarthritis of knee: Secondary | ICD-10-CM

## 2023-01-29 NOTE — Patient Instructions (Signed)
Injected both knees Happy Holidays See me again in 8-10 weeks

## 2023-01-29 NOTE — Assessment & Plan Note (Signed)
Chronic with exacerbation.  We discussed with patient about icing regimen and home exercises.  Continuing to try to stay active where possible.  Patient still wants to avoid surgical intervention if possible.  Do believe at some point in the future she will need a knee replacement.  Follow-up with me again 3 months

## 2023-04-09 DIAGNOSIS — Z6827 Body mass index (BMI) 27.0-27.9, adult: Secondary | ICD-10-CM | POA: Diagnosis not present

## 2023-04-09 DIAGNOSIS — Z1231 Encounter for screening mammogram for malignant neoplasm of breast: Secondary | ICD-10-CM | POA: Diagnosis not present

## 2023-04-09 DIAGNOSIS — Z01419 Encounter for gynecological examination (general) (routine) without abnormal findings: Secondary | ICD-10-CM | POA: Diagnosis not present

## 2023-04-09 DIAGNOSIS — Z13820 Encounter for screening for osteoporosis: Secondary | ICD-10-CM | POA: Diagnosis not present

## 2023-04-17 NOTE — Progress Notes (Signed)
 [name] [address] [place] [address] Phone: [phone] Subjective:   Mercedes [name], am serving as a scribe for Dr. Antoine Primas.  I'm seeing this patient by the request  of:  Nelwyn Salisbury, MD  CC: Right knee swelling and pain in the left knee compensation  [id number]  01/29/2023 Chronic with exacerbation.  We discussed with patient about icing regimen and home exercises.  Continuing to try to stay active where possible.  Patient still wants to avoid surgical intervention if possible.  Do believe at some point in the future she will need a knee replacement.  Follow-up with me again 3 months     Updated 04/19/2023 Mercedes [name] is a 63 y.o. female coming in with complaint of B knee pain. Patient had been doing well. Two weeks ago her pain increased and last night her pain got worse. Patient did go on a long hike a couple weeks ago that included a lot of inclines. Past few days she did a lot of walking as she has been moving her mom into assisted living facility. Pain and swelling over distal lateral quad. Antalgic gait today.        Past Medical History:  Diagnosis Date   Allergy    Anemia    Glaucoma    sees Dr. Sinda Du    Past Surgical History:  Procedure Laterality Date   BREAST BIOPSY Left 2017   benign   COLONOSCOPY  02/25/2018   per Dr. Leone Payor, no polyps, repeat in 10 yrs    dental implant  2013   KNEE ARTHROSCOPY  2004   ACL Repair   MOUTH SURGERY     2 weeks ago- has a retainer in, off antibiotics   OOPHORECTOMY  1996   left   Social History   Socioeconomic History   Marital status: Married    Spouse name: Not on file   Number of children: Not on file   Years of education: Not on file   Highest education level: Bachelor's degree (e.g., BA, AB, BS)  Occupational History   Not on file  Tobacco Use   Smoking status: Never   Smokeless tobacco: Never  Substance and Sexual Activity   Alcohol use:  Yes    Alcohol/week: 3.0 standard drinks of alcohol    Types: 3 Standard drinks or equivalent per week    Comment: occ   Drug use: No   Sexual activity: Not on file  Other Topics Concern   Not on file  Social History Narrative   Not on file   Social Drivers of Health   Financial Resource Strain: Low Risk  (07/16/2021)   Overall Financial Resource Strain (CARDIA)    Difficulty of Paying Living Expenses: Not hard at all  Food Insecurity: No Food Insecurity (07/16/2021)   Hunger Vital Sign    Worried About Running Out of Food in the Last Year: Never true    Ran Out of Food in the Last Year: Never true  Transportation Needs: No Transportation Needs (07/16/2021)   PRAPARE - Administrator, Civil Service (Medical): No    Lack of Transportation (Non-Medical): No  Physical Activity: Sufficiently Active (07/16/2021)   Exercise Vital Sign    Days of Exercise per Week: 5 days    Minutes of Exercise per Session: 90 min  Stress: No Stress Concern Present (07/16/2021)   [employer] of Occupational Health - Occupational Stress Questionnaire  Feeling of Stress : Not at all  Social Connections: Socially Integrated (07/16/2021)   Social Connection and Isolation Panel [NHANES]    Frequency of Communication with Friends and Family: More than three times a week    Frequency of Social Gatherings with Friends and Family: More than three times a week    Attends Religious Services: More than 4 times per year    Active Member of [employer] or Organizations: Yes    Attends Engineer, structural: Not on file    Marital Status: Married   Allergies  Allergen Reactions   Penicillins     Per pt: allergic as child   Sulfamethoxazole-Trimethoprim Hives   Family History  Problem Relation Age of Onset   Alcohol abuse Other    Asthma Other    Hyperlipidemia Other    Hypertension Other    Heart disease Other    Breast cancer Maternal Aunt    Colon cancer Neg Hx    Stomach cancer Neg Hx     Esophageal cancer Neg Hx    Rectal cancer Neg Hx     Current Outpatient Medications (Endocrine & Metabolic):    Estradiol (DIVIGEL) 0.25 MG/0.25GM GEL, Divigel 1 mg/gram (0.1 %) transdermal gel packet  USE DAILY AS DIRECTED   progesterone (PROMETRIUM) 100 MG capsule, Take 100 mg by mouth at bedtime.   Current Outpatient Medications (Respiratory):    cetirizine (ZYRTEC) 10 MG tablet, Take by mouth.    Current Outpatient Medications (Other):    latanoprost (XALATAN) 0.005 % ophthalmic solution, Place 1 drop into both eyes at bedtime.   Multiple Vitamins-Minerals (CENTRUM SILVER ULTRA WOMENS PO), Take by mouth daily.   Reviewed prior external information including notes and imaging from  primary care provider As well as notes that were available from care everywhere and other healthcare systems.  Past medical history, social, surgical and family history all reviewed in electronic medical record.  No pertanent information unless stated regarding to the chief complaint.   Review of Systems:  No headache, visual changes, nausea, vomiting, diarrhea, constipation, dizziness, abdominal pain, skin rash, fevers, chills, night sweats, weight loss, swollen lymph nodes, body aches, joint swelling, chest pain, shortness of breath, mood changes. POSITIVE muscle aches  Objective  Blood pressure 108/78, height 5\' 3"  (1.6 m), weight 154 lb (69.9 kg).   General: No apparent distress alert and oriented x3 mood and affect normal, dressed appropriately.  HEENT: Pupils equal, extraocular movements intact  Respiratory: Patient's speak in full sentences and does not appear short of breath  Cardiovascular: No lower extremity edema, non tender, no erythema  Antalgic gait noted.  Right knee has significant swelling with some limited range of motion.  Patient only has 80 degrees of flexion.  Procedure: Real-time Ultrasound Guided Injection of right knee Device: GE Logiq Q7 Ultrasound guided injection is  preferred based studies that show increased duration, increased effect, greater accuracy, decreased procedural pain, increased response rate, and decreased cost with ultrasound guided versus blind injection.  Verbal informed consent obtained.  Time-out conducted.  Noted no overlying erythema, induration, or other signs of local infection.  Skin prepped in a sterile fashion.  Local anesthesia: Topical Ethyl chloride.  With sterile technique and under real time ultrasound guidance: With a 22-gauge 2 inch needle patient was injected with 4 cc of 0.5% Marcaine and aspirated 910 cc of straw-colored fluid then injected 1 cc of Kenalog 40 mg/dL. This was from a superior lateral approach.  Completed without difficulty  Pain  immediately resolved suggesting accurate placement of the medication.  Advised to call if fevers/chills, erythema, induration, drainage, or persistent bleeding.  Images permanently stored  Impression: Technically successful ultrasound guided injection.  After informed written and verbal consent, patient was seated on exam table. Left knee was prepped with alcohol swab and utilizing anterolateral approach, patient's left knee space was injected with 4:1  marcaine 0.5%: Kenalog 40mg /dL. Patient tolerated the procedure well without immediate complications.    Impression and Recommendations:    The above documentation has been reviewed and is accurate and complete Judi Saa, DO

## 2023-04-19 ENCOUNTER — Encounter: Payer: Self-pay | Admitting: Family Medicine

## 2023-04-19 ENCOUNTER — Other Ambulatory Visit: Payer: Self-pay

## 2023-04-19 ENCOUNTER — Ambulatory Visit: Payer: BC Managed Care – PPO | Admitting: Family Medicine

## 2023-04-19 VITALS — BP 108/78 | Ht 63.0 in | Wt 154.0 lb

## 2023-04-19 DIAGNOSIS — M170 Bilateral primary osteoarthritis of knee: Secondary | ICD-10-CM | POA: Diagnosis not present

## 2023-04-19 DIAGNOSIS — G8929 Other chronic pain: Secondary | ICD-10-CM

## 2023-04-19 DIAGNOSIS — M25561 Pain in right knee: Secondary | ICD-10-CM | POA: Diagnosis not present

## 2023-04-19 NOTE — Patient Instructions (Signed)
 See you again in 10-12 weeks

## 2023-04-19 NOTE — Assessment & Plan Note (Signed)
 Chronic problem with exacerbation.  Discussed icing regimen and home exercises, discussed which activities to do and which ones to avoid.  Patient is still trying to avoid surgical intervention at this time.  Hopeful that this will make a fairly significant improvement.  Follow-up again in 2 to 3 months

## 2023-04-30 ENCOUNTER — Ambulatory Visit: Payer: BC Managed Care – PPO | Admitting: Family Medicine

## 2023-05-21 ENCOUNTER — Ambulatory Visit (INDEPENDENT_AMBULATORY_CARE_PROVIDER_SITE_OTHER): Admitting: Family Medicine

## 2023-05-21 ENCOUNTER — Encounter: Payer: Self-pay | Admitting: Family Medicine

## 2023-05-21 VITALS — BP 118/76 | HR 75 | Temp 98.3°F | Ht 63.0 in | Wt 153.0 lb

## 2023-05-21 DIAGNOSIS — Z0000 Encounter for general adult medical examination without abnormal findings: Secondary | ICD-10-CM

## 2023-05-21 NOTE — Progress Notes (Signed)
   Subjective:    Patient ID: Mercedes [name], female    DOB: 07/17/1960, 63 y.o.   MRN: [id number]  HPI Here for a well exam. She feels great.    Review of Systems  Constitutional: Negative.   HENT: Negative.    Eyes: Negative.   Respiratory: Negative.    Cardiovascular: Negative.   Gastrointestinal: Negative.   Genitourinary:  Negative for decreased urine volume, difficulty urinating, dyspareunia, dysuria, enuresis, flank pain, frequency, hematuria, pelvic pain and urgency.  Musculoskeletal: Negative.   Skin: Negative.   Neurological: Negative.  Negative for headaches.  Psychiatric/Behavioral: Negative.         Objective:   Physical Exam Constitutional:      General: She is not in acute distress.    Appearance: Normal appearance. She is well-developed.  HENT:     Head: Normocephalic and atraumatic.     Right Ear: External ear normal.     Left Ear: External ear normal.     Nose: Nose normal.     Mouth/Throat:     Pharynx: No oropharyngeal exudate.  Eyes:     General: No scleral icterus.    Conjunctiva/sclera: Conjunctivae normal.     Pupils: Pupils are equal, round, and reactive to light.  Neck:     Thyroid: No thyromegaly.     Vascular: No JVD.  Cardiovascular:     Rate and Rhythm: Normal rate and regular rhythm.     Pulses: Normal pulses.     Heart sounds: Normal heart sounds. No murmur heard.    No friction rub. No gallop.  Pulmonary:     Effort: Pulmonary effort is normal. No respiratory distress.     Breath sounds: Normal breath sounds. No wheezing or rales.  Chest:     Chest wall: No tenderness.  Abdominal:     General: Bowel sounds are normal. There is no distension.     Palpations: Abdomen is soft. There is no mass.     Tenderness: There is no abdominal tenderness. There is no guarding or rebound.  Musculoskeletal:        General: No tenderness. Normal range of motion.     Cervical back: Normal range of motion and neck supple.  Lymphadenopathy:      Cervical: No cervical adenopathy.  Skin:    General: Skin is warm and dry.     Findings: No erythema or rash.  Neurological:     General: No focal deficit present.     Mental Status: She is alert and oriented to person, place, and time.     Cranial Nerves: No cranial nerve deficit.     Motor: No abnormal muscle tone.     Coordination: Coordination normal.     Deep Tendon Reflexes: Reflexes are normal and symmetric. Reflexes normal.  Psychiatric:        Mood and Affect: Mood normal.        Behavior: Behavior normal.        Thought Content: Thought content normal.        Judgment: Judgment normal.           Assessment & Plan:  Well exam. We discussed diet and exercise. Get fasting labs. Gershon Crane, MD

## 2023-05-29 ENCOUNTER — Other Ambulatory Visit

## 2023-05-29 DIAGNOSIS — Z0000 Encounter for general adult medical examination without abnormal findings: Secondary | ICD-10-CM | POA: Diagnosis not present

## 2023-05-29 DIAGNOSIS — Z13220 Encounter for screening for lipoid disorders: Secondary | ICD-10-CM | POA: Diagnosis not present

## 2023-05-29 DIAGNOSIS — Z131 Encounter for screening for diabetes mellitus: Secondary | ICD-10-CM

## 2023-05-29 LAB — HEPATIC FUNCTION PANEL
ALT: 13 U/L (ref 0–35)
AST: 18 U/L (ref 0–37)
Albumin: 4.6 g/dL (ref 3.5–5.2)
Alkaline Phosphatase: 75 U/L (ref 39–117)
Bilirubin, Direct: 0.1 mg/dL (ref 0.0–0.3)
Total Bilirubin: 0.6 mg/dL (ref 0.2–1.2)
Total Protein: 6.8 g/dL (ref 6.0–8.3)

## 2023-05-29 LAB — CBC WITH DIFFERENTIAL/PLATELET
Basophils Absolute: 0 10*3/uL (ref 0.0–0.1)
Basophils Relative: 0.9 % (ref 0.0–3.0)
Eosinophils Absolute: 0.1 10*3/uL (ref 0.0–0.7)
Eosinophils Relative: 2.6 % (ref 0.0–5.0)
HCT: 39.4 % (ref 36.0–46.0)
Hemoglobin: 13.5 g/dL (ref 12.0–15.0)
Lymphocytes Relative: 27.7 % (ref 12.0–46.0)
Lymphs Abs: 1.2 10*3/uL (ref 0.7–4.0)
MCHC: 34.2 g/dL (ref 30.0–36.0)
MCV: 88.2 fl (ref 78.0–100.0)
Monocytes Absolute: 0.3 10*3/uL (ref 0.1–1.0)
Monocytes Relative: 6.7 % (ref 3.0–12.0)
Neutro Abs: 2.6 10*3/uL (ref 1.4–7.7)
Neutrophils Relative %: 62.1 % (ref 43.0–77.0)
Platelets: 303 10*3/uL (ref 150.0–400.0)
RBC: 4.47 Mil/uL (ref 3.87–5.11)
RDW: 13.3 % (ref 11.5–15.5)
WBC: 4.2 10*3/uL (ref 4.0–10.5)

## 2023-05-29 LAB — LIPID PANEL
Cholesterol: 172 mg/dL (ref 0–200)
HDL: 67.9 mg/dL (ref >39.00)
LDL Cholesterol: 88 mg/dL (ref 0–99)
NonHDL: 103.6
Total CHOL/HDL Ratio: 3
Triglycerides: 76 mg/dL (ref 0.0–149.0)
VLDL: 15.2 mg/dL (ref 0.0–40.0)

## 2023-05-29 LAB — BASIC METABOLIC PANEL WITH GFR
BUN: 19 mg/dL (ref 6–23)
CO2: 26 meq/L (ref 19–32)
Calcium: 9 mg/dL (ref 8.4–10.5)
Chloride: 103 meq/L (ref 96–112)
Creatinine, Ser: 0.74 mg/dL (ref 0.40–1.20)
GFR: 85.71 mL/min (ref >60.00)
Glucose, Bld: 91 mg/dL (ref 70–99)
Potassium: 4.9 meq/L (ref 3.5–5.1)
Sodium: 137 meq/L (ref 135–145)

## 2023-05-29 LAB — HEMOGLOBIN A1C: Hgb A1c MFr Bld: 5.3 % (ref 4.6–6.5)

## 2023-05-29 LAB — TSH: TSH: 3.28 u[IU]/mL (ref 0.35–5.50)

## 2023-05-31 ENCOUNTER — Encounter: Payer: Self-pay | Admitting: Family Medicine

## 2023-06-13 ENCOUNTER — Encounter: Payer: Self-pay | Admitting: *Deleted

## 2023-06-19 ENCOUNTER — Encounter: Payer: Self-pay | Admitting: Family Medicine

## 2023-06-19 ENCOUNTER — Ambulatory Visit: Admitting: Family Medicine

## 2023-06-19 VITALS — BP 118/86 | HR 74 | Ht 63.0 in | Wt 152.0 lb

## 2023-06-19 DIAGNOSIS — M170 Bilateral primary osteoarthritis of knee: Secondary | ICD-10-CM

## 2023-06-19 NOTE — Progress Notes (Signed)
 [facility] [address] [place] [address] Phone: [phone] Subjective:   Mercedes [name], am serving as a scribe for Dr. Ronnell Coins.  I'm seeing this patient by the request  of:  Donley Furth, MD  CC: Bilateral knee pain  [id number]  04/19/2023 Chronic problem with exacerbation.  Discussed icing regimen and home exercises, discussed which activities to do and which ones to avoid.  Patient is still trying to avoid surgical intervention at this time.  Hopeful that this will make a fairly significant improvement.  Follow-up again in 2 to 3 months      Update 06/19/2023 Mercedes [name] is a 63 y.o. female coming in with complaint of B knee pain. Patient states that she feels like the L knee has fluid in it. Feels that the B knee pain is worse than 6 months ago. Has surgical consult with Dr. Bernard Brick on May 16th for knees. Taking more Advil and using pillows at night due to pain. If she has to have surgery she wants to have it in August after a family vacation.     Past Medical History:  Diagnosis Date   Allergy    Anemia    Glaucoma    sees Dr. Cindra Cree    Past Surgical History:  Procedure Laterality Date   BREAST BIOPSY Left 2017   benign   COLONOSCOPY  02/25/2018   per Dr. Willy Harvest, no polyps, repeat in 10 yrs    dental implant  2013   KNEE ARTHROSCOPY  2004   ACL Repair   MOUTH SURGERY     2 weeks ago- has a retainer in, off antibiotics   OOPHORECTOMY  1996   left   Social History   Socioeconomic History   Marital status: Married    Spouse name: Not on file   Number of children: Not on file   Years of education: Not on file   Highest education level: Bachelor's degree (e.g., BA, AB, BS)  Occupational History   Not on file  Tobacco Use   Smoking status: Never   Smokeless tobacco: Never  Substance and Sexual Activity   Alcohol use: Yes    Alcohol/week: 3.0 standard drinks of alcohol    Types: 3 Standard drinks or  equivalent per week    Comment: occ   Drug use: No   Sexual activity: Not on file  Other Topics Concern   Not on file  Social History Narrative   Not on file   Social Drivers of Health   Financial Resource Strain: Low Risk  (07/16/2021)   Overall Financial Resource Strain (CARDIA)    Difficulty of Paying Living Expenses: Not hard at all  Food Insecurity: No Food Insecurity (07/16/2021)   Hunger Vital Sign    Worried About Running Out of Food in the Last Year: Never true    Ran Out of Food in the Last Year: Never true  Transportation Needs: No Transportation Needs (07/16/2021)   PRAPARE - Administrator, Civil Service (Medical): No    Lack of Transportation (Non-Medical): No  Physical Activity: Sufficiently Active (07/16/2021)   Exercise Vital Sign    Days of Exercise per Week: 5 days    Minutes of Exercise per Session: 90 min  Stress: No Stress Concern Present (07/16/2021)   [employer] of Occupational Health - Occupational Stress Questionnaire    Feeling of Stress : Not at all  Social Connections: Socially Integrated (07/16/2021)  Social Advertising account executive [NHANES]    Frequency of Communication with Friends and Family: More than three times a week    Frequency of Social Gatherings with Friends and Family: More than three times a week    Attends Religious Services: More than 4 times per year    Active Member of [employer] or Organizations: Yes    Attends Engineer, structural: Not on file    Marital Status: Married   Allergies  Allergen Reactions   Penicillins     Per pt: allergic as child   Sulfamethoxazole-Trimethoprim Hives   Family History  Problem Relation Age of Onset   Alcohol abuse Other    Asthma Other    Hyperlipidemia Other    Hypertension Other    Heart disease Other    Breast cancer Maternal Aunt    Colon cancer Neg Hx    Stomach cancer Neg Hx    Esophageal cancer Neg Hx    Rectal cancer Neg Hx     Current Outpatient  Medications (Endocrine & Metabolic):    Estradiol (DIVIGEL) 0.25 MG/0.25GM GEL, Divigel 1 mg/gram (0.1 %) transdermal gel packet  USE DAILY AS DIRECTED   progesterone (PROMETRIUM) 100 MG capsule, Take 100 mg by mouth at bedtime.   Current Outpatient Medications (Respiratory):    cetirizine (ZYRTEC) 10 MG tablet, Take by mouth.    Current Outpatient Medications (Other):    latanoprost  (XALATAN ) 0.005 % ophthalmic solution, Place 1 drop into both eyes at bedtime.   Multiple Vitamins-Minerals (CENTRUM SILVER ULTRA WOMENS PO), Take by mouth daily.   Reviewed prior external information including notes and imaging from  primary care provider As well as notes that were available from care everywhere and other healthcare systems.  Past medical history, social, surgical and family history all reviewed in electronic medical record.  No pertanent information unless stated regarding to the chief complaint.   Review of Systems:  No headache, visual changes, nausea, vomiting, diarrhea, constipation, dizziness, abdominal pain, skin rash, fevers, chills, night sweats, weight loss, swollen lymph nodes, body aches, joint swelling, chest pain, shortness of breath, mood changes. POSITIVE muscle aches  Objective  Blood pressure 118/86, pulse 74, height 5\' 3"  (1.6 m), weight 152 lb (68.9 kg), SpO2 100%.   General: No apparent distress alert and oriented x3 mood and affect normal, dressed appropriately.  HEENT: Pupils equal, extraocular movements intact  Respiratory: Patient's speak in full sentences and does not appear short of breath  Cardiovascular: No lower extremity edema, non tender, no erythema  Bilateral knees do have some crepitus noted.  Does have an effusion noted of the knees bilaterally.  Instability with valgus and varus force  After informed written and verbal consent, patient was seated on exam table. Right knee was prepped with alcohol swab and utilizing anterolateral approach, patient's  right knee space was injected with 4:1  marcaine 0.5%: Kenalog  40mg /dL. Patient tolerated the procedure well without immediate complications.  After informed written and verbal consent, patient was seated on exam table. Left knee was prepped with alcohol swab and utilizing anterolateral approach, patient's left knee space was injected with 4:1  marcaine 0.5%: Kenalog  40mg /dL. Patient tolerated the procedure well without immediate complications.    Impression and Recommendations:

## 2023-06-19 NOTE — Assessment & Plan Note (Signed)
 Chronic problem again.  Patient has had injections intermittently for quite some time.  Do believe at this point that surgical intervention is the next step.  Patient is going to be seen in the another orthopedic surgeon to discuss in the next 5 to 6 weeks.  We discussed that probably surgery would be pushed back from 3 months from today patient states that that does work with her timeline.  Can follow-up with me on an as-needed basis

## 2023-06-19 NOTE — Patient Instructions (Signed)
 Injected knee today See me again in

## 2023-06-25 ENCOUNTER — Ambulatory Visit: Admitting: Family Medicine

## 2023-06-27 ENCOUNTER — Ambulatory Visit: Admitting: Orthopaedic Surgery

## 2023-06-27 DIAGNOSIS — M1711 Unilateral primary osteoarthritis, right knee: Secondary | ICD-10-CM

## 2023-06-27 DIAGNOSIS — M1712 Unilateral primary osteoarthritis, left knee: Secondary | ICD-10-CM | POA: Diagnosis not present

## 2023-06-27 DIAGNOSIS — M170 Bilateral primary osteoarthritis of knee: Secondary | ICD-10-CM

## 2023-06-27 NOTE — Progress Notes (Signed)
 Mercedes [name] is an incredibly pleasant and active 63 year old female with unfortunately debilitating arthritis in both of her knees.  She is a regular patient of Dr. Ronnell Coins who has injected her knees over the years.  She has remote history of an ACL reconstruction of the left knee but also meniscal tearing of the right knee.  She tries to stay active and is at the point where she wants to consider knee replacement surgery which is absolutely appropriate.  I have seen plain films of both her knees as well as the MRI of her right knee.  Both knees have slight varus malalignment with bone-on-bone wear of the medial compartment and her MRI shows a significantly worse on the right knee but also left knee is significantly bad.  At this point her knee pain is daily and it is detrimentally acting her mobility, her quality of life and her actives daily living.  We had a long and thorough discussion about knee replacement surgery.  She appropriately inquired about robotic surgery.  I did let her know that there is no robotic surgery that is performed in [place].  The literature shows that robotic surgery and traditional knee replacement surgery have the same outcomes and I believe that robotic surgery is definitely an useful and needs to have significant deformities which her knee does not.  However there are surgeons that are quite proficient in robotic surgery and I have recommended one of my colleagues over [facility] Dr. Dessie Flow who performs robotic surgery on a regular basis.  She did state that one of her neighbors had robotic surgery in [place] and I let her know that there is no one who performs robotic surgery in [place] except for someone who does do some computer navigated type of surgery but that is not robotic surgery and that is for minimal cuts.  Showed her knee replacement model and went over what the surgery involves.  Both knees slightly hyperextend and have pain throughout the arc of  motion.  Both knees have slight varus malalignment that is correctable and medial joint tenderness with patellofemoral crepitation.  This is on the and she is still going to think about.  I am happy with getting her set up for a total knee arthroplasty if she would like us  to do so.  I am also happy to refer her to my colleague.  She is thinking about having her left knee replaced first.  She will call and let us  know.

## 2023-06-28 DIAGNOSIS — G8929 Other chronic pain: Secondary | ICD-10-CM | POA: Diagnosis not present

## 2023-06-28 DIAGNOSIS — M170 Bilateral primary osteoarthritis of knee: Secondary | ICD-10-CM | POA: Diagnosis not present

## 2023-07-12 ENCOUNTER — Ambulatory Visit: Payer: Self-pay

## 2023-07-12 NOTE — Telephone Encounter (Signed)
  1st attempt, left voicemail for patient to return call for nurse triage.  Copied from CRM [id number]. Topic: Clinical - Medication Question >> Jul 12, 2023 12:41 PM Mercedes [name] wrote: Reason for CRM: Patient is scheduled for a total knee replacement left knee on August 14th. Orthopedic put patient on inflammatory and it's not working really well but patient is not sleeping well. Tylenol is allowed but it's not doing anything for leg inflammation due to arhitis.   Patient is asking if for that week before surgery may she receive a sleeping pill for the week before? She stated best contact is mobile number [phone].

## 2023-07-12 NOTE — Telephone Encounter (Signed)
 Chief Complaint: preop clearance Symptoms: preop clearance and medication   Disposition: [] ED /[] Urgent Care (no appt availability in office) / [x] Appointment(In office/virtual)/ []  [facility]/ [] Home Care/ [] Refused Recommended Disposition /[] [facility]/ [x]  Follow-up with PCP Additional Notes: Pt states that she is scheduled for surgery in Aug. States that she was reading over the paperwork and it states that she needs clearance from her PCP. Pt states she is also need lab work for this as well. States that she will be doing a lot of traveling this summer. States that she is only allowed to take the medication up until a week prior to surgery and was questioning about a sleeping medication for the week prior to. Pt is need a CBC and CMP done for clearance and wants these to ordered prior to appt.   Reason for Disposition  [1] Caller requesting NON-URGENT health information AND [2] PCP's office is the best resource  Protocols used: Information Only Call - No Triage-A-AH

## 2023-07-15 NOTE — Telephone Encounter (Signed)
Left a message for pt to call the office regarding this message  

## 2023-07-22 NOTE — Telephone Encounter (Signed)
 Pt has OV app with Dr Alyne Babinski on 08/28/23 to complete the surgical clearance form

## 2023-07-24 ENCOUNTER — Ambulatory Visit: Admitting: Orthopaedic Surgery

## 2023-08-03 ENCOUNTER — Emergency Department (HOSPITAL_BASED_OUTPATIENT_CLINIC_OR_DEPARTMENT_OTHER): Admission: EM | Admit: 2023-08-03 | Discharge: 2023-08-03 | Disposition: A | Discharge status: Home / Self Care

## 2023-08-03 ENCOUNTER — Encounter (HOSPITAL_BASED_OUTPATIENT_CLINIC_OR_DEPARTMENT_OTHER): Payer: Self-pay | Admitting: *Deleted

## 2023-08-03 ENCOUNTER — Other Ambulatory Visit: Payer: Self-pay

## 2023-08-03 ENCOUNTER — Telehealth: Admitting: Physician Assistant

## 2023-08-03 DIAGNOSIS — H6092 Unspecified otitis externa, left ear: Secondary | ICD-10-CM | POA: Diagnosis not present

## 2023-08-03 DIAGNOSIS — H9202 Otalgia, left ear: Secondary | ICD-10-CM

## 2023-08-03 DIAGNOSIS — H60502 Unspecified acute noninfective otitis externa, left ear: Secondary | ICD-10-CM

## 2023-08-03 DIAGNOSIS — H60512 Acute actinic otitis externa, left ear: Secondary | ICD-10-CM | POA: Diagnosis not present

## 2023-08-03 MED ORDER — CIPROFLOXACIN-DEXAMETHASONE 0.3-0.1 % OT SUSP: 4.0000 [drp] | Freq: Two times a day (BID) | OTIC | 0 refills | Status: AC

## 2023-08-03 NOTE — Patient Instructions (Signed)
  Mercedes [name], thank you for joining [name], PA-C for today's virtual visit.  While this provider is not your primary care provider (PCP), if your PCP is located in our provider database this encounter information will be shared with them immediately following your visit.   A [facility] MyChart account gives you access to today's visit and all your visits, tests, and labs performed at [facility]  click here if you don't have a [facility] MyChart account or go to [email or web address]  Consent: (Patient) Mercedes [name] provided verbal consent for this virtual visit at the beginning of the encounter.  Current Medications:  Current Outpatient Medications:    cetirizine (ZYRTEC) 10 MG tablet, Take by mouth., Disp: , Rfl:    Estradiol (DIVIGEL) 0.25 MG/0.25GM GEL, Divigel 1 mg/gram (0.1 %) transdermal gel packet  USE DAILY AS DIRECTED, Disp: , Rfl:    latanoprost  (XALATAN ) 0.005 % ophthalmic solution, Place 1 drop into both eyes at bedtime., Disp: 2.5 mL, Rfl: 12   Multiple Vitamins-Minerals (CENTRUM SILVER ULTRA WOMENS PO), Take by mouth daily., Disp: , Rfl:    progesterone (PROMETRIUM) 100 MG capsule, Take 100 mg by mouth at bedtime., Disp: , Rfl: 12   Medications ordered in this encounter:  No orders of the defined types were placed in this encounter.    *If you need refills on other medications prior to your next appointment, please contact your pharmacy*  Follow-Up: Call back or seek an in-person evaluation if the symptoms worsen or if the condition fails to improve as anticipated.  [facility] [phone]  Other Instructions Report to nearest Urgent Care.    If you have been instructed to have an in-person evaluation today at a local Urgent Care facility, please use the link below. It will take you to a list of all of our available [facility], including address, phone number and hours of operation. Please do not delay  care.  [facility]  If you or a family member do not have a primary care provider, use the link below to schedule a visit and establish care. When you choose a [facility] primary care physician or advanced practice provider, you gain a long-term partner in health. Find a Primary Care Provider  Learn more about [facility] in-office and virtual care options: [facility] - Get Care Now

## 2023-08-03 NOTE — ED Triage Notes (Signed)
 Patient to ED reporting left sided ear pain. No fever, flu like symptoms.   Patient reports having had multiple ear infections in the past and reports using an old prescribed ear antibiotic without improvement.

## 2023-08-03 NOTE — Discharge Instructions (Addendum)
 Please follow-up with your primary doctor.  Return immediately if develop fevers, chills, severe headache, neck stiffness, photophobia, or any new or worsening symptoms that are concerning to

## 2023-08-03 NOTE — Progress Notes (Signed)
 Virtual Visit Consent   Mercedes [name], you are scheduled for a virtual visit with a [facility] provider today. Just as with appointments in the office, your consent must be obtained to participate. Your consent will be active for this visit and any virtual visit you may have with one of our providers in the next 365 days. If you have a MyChart account, a copy of this consent can be sent to you electronically.  As this is a virtual visit, video technology does not allow for your provider to perform a traditional examination. This may limit your provider's ability to fully assess your condition. If your provider identifies any concerns that need to be evaluated in person or the need to arrange testing (such as labs, EKG, etc.), we will make arrangements to do so. Although advances in technology are sophisticated, we cannot ensure that it will always work on either your end or our end. If the connection with a video visit is poor, the visit may have to be switched to a telephone visit. With either a video or telephone visit, we are not always able to ensure that we have a secure connection.  By engaging in this virtual visit, you consent to the provision of healthcare and authorize for your insurance to be billed (if applicable) for the services provided during this visit. Depending on your insurance coverage, you may receive a charge related to this service.  I need to obtain your verbal consent now. Are you willing to proceed with your visit today? [name] has provided verbal consent on 08/03/2023 for a virtual visit (video or telephone). Mercedes [name], [place]  Date: 08/03/2023 5:30 PM   Virtual Visit via Video Note   I, Mercedes [name], connected with  [name]  ([id number], 15-Nov-1960) on 08/03/23 at  5:30 PM EDT by a video-enabled telemedicine application and verified that I am speaking with the correct person using two identifiers.  Location: Patient: Virtual Visit Location  Patient: Home Provider: Virtual Visit Location Provider: Home Office   I discussed the limitations of evaluation and management by telemedicine and the availability of in person appointments. The patient expressed understanding and agreed to proceed.    History of Present Illness: Mercedes [name] is a 63 y.o. who identifies as a female who was assigned female at birth, and is being seen today for left ear pain.  HPI: Otalgia  There is pain in the left ear. This is a new problem. The current episode started today. The problem occurs constantly. The problem has been unchanged. There has been no fever. The pain is at a severity of 7/10. The pain is moderate. Pertinent negatives include no abdominal pain, coughing, diarrhea, ear discharge, headaches, hearing loss, neck pain, rash, rhinorrhea, sore throat or vomiting. She has tried nothing for the symptoms. The treatment provided no relief.    Problems:  Patient Active Problem List   Diagnosis Date Noted   Bilateral primary osteoarthritis of knee 08/01/2021   Ganglion cyst of right foot 08/01/2021   Acute tear medial meniscus, left, initial encounter 05/08/2021   Unilateral primary osteoarthritis, right knee 06/01/2020   Right knee pain 03/30/2020   Right foot pain 03/30/2020   Glaucoma 09/01/2019   History of colonic polyps 07/17/2011   Allergic rhinitis 03/02/2009   ANEMIA-NOS 12/23/2006    Allergies:  Allergies  Allergen Reactions   Penicillins     Per pt: allergic as child   Sulfamethoxazole-Trimethoprim Hives   Medications:  Current  Outpatient Medications:    cetirizine (ZYRTEC) 10 MG tablet, Take by mouth., Disp: , Rfl:    Estradiol (DIVIGEL) 0.25 MG/0.25GM GEL, Divigel 1 mg/gram (0.1 %) transdermal gel packet  USE DAILY AS DIRECTED, Disp: , Rfl:    latanoprost  (XALATAN ) 0.005 % ophthalmic solution, Place 1 drop into both eyes at bedtime., Disp: 2.5 mL, Rfl: 12   Multiple Vitamins-Minerals (CENTRUM SILVER ULTRA WOMENS PO), Take  by mouth daily., Disp: , Rfl:    progesterone (PROMETRIUM) 100 MG capsule, Take 100 mg by mouth at bedtime., Disp: , Rfl: 12  Observations/Objective: Patient is well-developed, well-nourished in no acute distress.  Resting comfortably  at home.  Head is normocephalic, atraumatic.  No labored breathing. Speech is clear and coherent with logical content.  Patient is alert and oriented at baseline.    Assessment and Plan: 1. Otalgia of left ear (Primary)  Patient presenting with otalgia x 1 day. No drainage however treated with advil, antihistamine, and flonase . Advised to present in person for otoscopic exam and assessment. She agreed to report to the nearest urgent care for evaluation.   Follow Up Instructions: I discussed the assessment and treatment plan with the patient. The patient was provided an opportunity to ask questions and all were answered. The patient agreed with the plan and demonstrated an understanding of the instructions.  A copy of instructions were sent to the patient via MyChart unless otherwise noted below.   The patient was advised to call back or seek an in-person evaluation if the symptoms worsen or if the condition fails to improve as anticipated.    Mercedes Shuck, PA-C

## 2023-08-03 NOTE — ED Provider Notes (Signed)
  [facility] DE[facility]MENT AT [facility] Provider Note   CSN: [id number] Arrival date & time: 08/03/23  8251     Patient presents with: Mercedes [name] is a 63 y.o. female.   Presents with otalgia on left side today.  No fevers or chills.  Reports similar to prior external ear infections.  No hearing loss.  Took Tylenol prior to arrival   Otalgia      Prior to Admission medications   Medication Sig Start Date End Date Taking? Authorizing Provider  cetirizine (ZYRTEC) 10 MG tablet Take by mouth.    [provider]  Estradiol (DIVIGEL) 0.25 MG/0.25GM GEL Divigel 1 mg/gram (0.1 %) transdermal gel packet  USE DAILY AS DIRECTED 07/10/19   [provider]  latanoprost  (XALATAN ) 0.005 % ophthalmic solution Place 1 drop into both eyes at bedtime. 09/01/19   Johnny Garnette DELENA, MD  Multiple Vitamins-Minerals (CENTRUM SILVER ULTRA WOMENS PO) Take by mouth daily.    [provider]  progesterone (PROMETRIUM) 100 MG capsule Take 100 mg by mouth at bedtime. 11/20/17   [provider]    Allergies: Penicillins and Sulfamethoxazole-trimethoprim    Review of Systems  HENT:  Positive for ear pain.     Updated Vital Signs BP (!) 152/114 (BP Location: Left Arm)   Pulse 80   Temp 98.5 F (36.9 C) (Tympanic)   Resp 14   SpO2 100%   Physical Exam Vitals and nursing note reviewed.  Constitutional:      General: She is not in acute distress.    Appearance: She is not toxic-appearing.  HENT:     Head: Normocephalic.     Right Ear: Tympanic membrane normal.     Left Ear: Tympanic membrane normal.     Ears:     Comments: Left ear with inflammation to the external auditory canal with some granulation type tissue  Cardiovascular:     Rate and Rhythm: Normal rate.  Pulmonary:     Effort: Pulmonary effort is normal.   Skin:    General: Skin is warm and dry.     Capillary Refill: Capillary refill takes less than 2 seconds.    Neurological:     Mental Status: She is alert.     Gait: Gait normal.   Psychiatric:        Mood and Affect: Mood normal.     (all labs ordered are listed, but only abnormal results are displayed) Labs Reviewed - No data to display  EKG: None  Radiology: No results found.   Procedures   Medications Ordered in the ED - No data to display                                  Medical Decision Making Well-appearing 63 year old female with left ear pain.  Afebrile vital signs reassuring.  Exam with otitis externa.  TM within normal limits.  Exam not consistent with otitis media or malignant otitis.  Clinically well-appearing.  Discussed analgesics with Tylenol/NSAIDs.  Will discharge with prescription for antibiotic drops.  Follow-up with PCP.  Return precautions given      Final diagnoses:  None    ED Discharge Orders     None          Neysa Caron PARAS, DO 08/03/23 1837

## 2023-08-03 NOTE — ED Notes (Signed)
 Dc instructions reviewed with patient. Patient voiced understanding. Dc with belongings.

## 2023-08-07 ENCOUNTER — Telehealth: Payer: Self-pay

## 2023-08-07 NOTE — Transitions of Care (Post Inpatient/ED Visit) (Signed)
   08/07/2023  Name: Mercedes [name] MRN: [id number] DOB: April 10, 1960  Today's TOC FU Call Status: Today's TOC FU Call Status:: Successful TOC FU Call Completed TOC FU Call Complete Date: 08/07/23 Patient's Name and Date of Birth confirmed.  Transition Care Management Follow-up Telephone Call Date of Discharge: 08/03/23  Items Reviewed: Did you receive and understand the discharge instructions provided?: Yes Medications obtained,verified, and reconciled?: Yes (Medications Reviewed) Any new allergies since your discharge?: No Dietary orders reviewed?: No Do you have support at home?: Yes People in Home [RPT]: spouse Name of Support/Comfort Primary Source: robert Bowmer  Medications Reviewed Today: Medications Reviewed Today     Reviewed by Elner Shan NOVAK, CMA (Certified Medical Assistant) on 08/07/23 at 1307  Med List Status: <None>   Medication Order Taking? Sig Documenting Provider Last Dose Status Informant  cetirizine (ZYRTEC) 10 MG tablet [id number] Yes Take by mouth. [provider]  Active   ciprofloxacin -dexamethasone (CIPRODEX) OTIC suspension [id number] Yes Place 4 drops into the left ear 2 (two) times daily for 7 days. Neysa Caron PARAS, DO  Active   Estradiol (DIVIGEL) 0.25 MG/0.25GM GEL [id number] Yes Divigel 1 mg/gram (0.1 %) transdermal gel packet  USE DAILY AS DIRECTED [provider]  Active   latanoprost  (XALATAN ) 0.005 % ophthalmic solution [id number] Yes Place 1 drop into both eyes at bedtime. Johnny Garnette DELENA, MD  Active   Multiple Vitamins-Minerals (CENTRUM SILVER ULTRA WOMENS PO) [id number] Yes Take by mouth daily. [provider]  Active   progesterone (PROMETRIUM) 100 MG capsule [id number] Yes Take 100 mg by mouth at bedtime. [provider]  Active             Home Care and Equipment/Supplies: Were Home Health Services Ordered?: No Any new equipment or medical supplies ordered?: No  Functional Questionnaire: Do you need  assistance with bathing/showering or dressing?: No Do you need assistance with meal preparation?: No Do you need assistance with eating?: No Do you have difficulty maintaining continence: No Do you need assistance with getting out of bed/getting out of a chair/moving?: No Do you have difficulty managing or taking your medications?: No  Follow up appointments reviewed: PCP Follow-up appointment confirmed?: Yes Date of PCP follow-up appointment?: 08/28/23 Follow-up Provider: [facility] Follow-up appointment confirmed?: NA Do you need transportation to your follow-up appointment?: No Do you understand care options if your condition(s) worsen?: Yes-patient verbalized understanding    SIGNATURE Morissa Obeirne, cma

## 2023-08-20 DIAGNOSIS — M25562 Pain in left knee: Secondary | ICD-10-CM | POA: Diagnosis not present

## 2023-08-21 ENCOUNTER — Telehealth: Payer: Self-pay | Admitting: Family Medicine

## 2023-08-21 NOTE — Telephone Encounter (Signed)
 Copied from CRM [id number]. Topic: Clinical - Request for Lab/Test Order >> Aug 21, 2023 10:07 AM Larissa RAMAN wrote: Reason for CRM: Patient requesting to have labs completed during her 08/28/2023 appointment.

## 2023-08-28 ENCOUNTER — Ambulatory Visit (INDEPENDENT_AMBULATORY_CARE_PROVIDER_SITE_OTHER): Admitting: Family Medicine

## 2023-08-28 ENCOUNTER — Encounter: Payer: Self-pay | Admitting: Family Medicine

## 2023-08-28 VITALS — BP 118/78 | HR 77 | Temp 98.6°F | Wt 160.4 lb

## 2023-08-28 DIAGNOSIS — Z01818 Encounter for other preprocedural examination: Secondary | ICD-10-CM

## 2023-08-28 LAB — BASIC METABOLIC PANEL WITH GFR
BUN: 20 mg/dL (ref 6–23)
CO2: 25 meq/L (ref 19–32)
Calcium: 9 mg/dL (ref 8.4–10.5)
Chloride: 104 meq/L (ref 96–112)
Creatinine, Ser: 0.66 mg/dL (ref 0.40–1.20)
GFR: 92.77 mL/min (ref >60.00)
Glucose, Bld: 87 mg/dL (ref 70–99)
Potassium: 4.8 meq/L (ref 3.5–5.1)
Sodium: 138 meq/L (ref 135–145)

## 2023-08-28 LAB — CBC WITH DIFFERENTIAL/PLATELET
Basophils Absolute: 0 K/uL (ref 0.0–0.1)
Basophils Relative: 0.6 % (ref 0.0–3.0)
Eosinophils Absolute: 0 K/uL (ref 0.0–0.7)
Eosinophils Relative: 1 % (ref 0.0–5.0)
HCT: 36.8 % (ref 36.0–46.0)
Hemoglobin: 12.7 g/dL (ref 12.0–15.0)
Lymphocytes Relative: 24.2 % (ref 12.0–46.0)
Lymphs Abs: 1 K/uL (ref 0.7–4.0)
MCHC: 34.7 g/dL (ref 30.0–36.0)
MCV: 87.3 fl (ref 78.0–100.0)
Monocytes Absolute: 0.3 K/uL (ref 0.1–1.0)
Monocytes Relative: 8 % (ref 3.0–12.0)
Neutro Abs: 2.7 K/uL (ref 1.4–7.7)
Neutrophils Relative %: 66.2 % (ref 43.0–77.0)
Platelets: 256 K/uL (ref 150.0–400.0)
RBC: 4.21 Mil/uL (ref 3.87–5.11)
RDW: 14.2 % (ref 11.5–15.5)
WBC: 4.1 K/uL (ref 4.0–10.5)

## 2023-08-28 LAB — HEMOGLOBIN A1C: Hgb A1c MFr Bld: 5.4 % (ref 4.6–6.5)

## 2023-08-28 MED ORDER — TEMAZEPAM 30 MG PO CAPS: 30.0000 mg | ORAL_CAPSULE | Freq: Every evening | ORAL | 0 refills | Status: DC | PRN

## 2023-08-28 NOTE — Progress Notes (Signed)
   Subjective:    Patient ID: Mercedes [name], female    DOB: December 05, 1960, 63 y.o.   MRN: [id number]  HPI Here for a preoperative clearance exam. She is scheduled for a left knee replacement surgery per Dr. Ernie on 09-26-23. Other than knee pain she feels fine an has no concerns. She has been taking Diclofenac  for pain.    Review of Systems  Constitutional: Negative.   Respiratory: Negative.    Cardiovascular: Negative.   Gastrointestinal: Negative.   Genitourinary: Negative.   Musculoskeletal:  Positive for arthralgias.  Neurological: Negative.        Objective:   Physical Exam Constitutional:      Appearance: Normal appearance.  Cardiovascular:     Rate and Rhythm: Normal rate and regular rhythm.     Pulses: Normal pulses.     Heart sounds: Normal heart sounds.  Pulmonary:     Effort: Pulmonary effort is normal.     Breath sounds: Normal breath sounds.  Abdominal:     General: Abdomen is flat. Bowel sounds are normal. There is no distension.     Palpations: Abdomen is soft. There is no mass.     Tenderness: There is no abdominal tenderness. There is no right CVA tenderness, left CVA tenderness, guarding or rebound.     Hernia: No hernia is present.  Musculoskeletal:     Right lower leg: No edema.     Left lower leg: No edema.  Neurological:     General: No focal deficit present.     Mental Status: She is alert and oriented to person, place, and time.           Assessment & Plan:  Preoperative clearance. She is doing well in general. We will get a CBC, BMET, and A1c today. She asks for something to help her sleep prior to the surgery because she is very anxious about it, so we gave her some Temazepam  to use. We spent a total of ( 32  ) minutes reviewing records and discussing these issues.  Garnette Olmsted, MD

## 2023-08-29 ENCOUNTER — Ambulatory Visit: Admitting: Family Medicine

## 2023-08-29 ENCOUNTER — Ambulatory Visit: Payer: Self-pay | Admitting: Family Medicine

## 2023-09-26 DIAGNOSIS — M1712 Unilateral primary osteoarthritis, left knee: Secondary | ICD-10-CM | POA: Diagnosis not present

## 2023-11-08 ENCOUNTER — Telehealth: Payer: Self-pay | Admitting: Podiatry

## 2023-11-08 NOTE — Telephone Encounter (Signed)
 Called and spoke to patient informing her insurance is inactive. She states she made appoitment on MyChart and added it there but will call prior to appointment to verify

## 2023-11-14 ENCOUNTER — Encounter: Payer: Self-pay | Admitting: Family Medicine

## 2023-11-14 ENCOUNTER — Ambulatory Visit (INDEPENDENT_AMBULATORY_CARE_PROVIDER_SITE_OTHER): Admitting: Family Medicine

## 2023-11-14 ENCOUNTER — Ambulatory Visit: Admitting: Podiatry

## 2023-11-14 VITALS — BP 110/68 | HR 72 | Temp 98.4°F | Wt 149.2 lb

## 2023-11-14 DIAGNOSIS — G4700 Insomnia, unspecified: Secondary | ICD-10-CM

## 2023-11-14 MED ORDER — TEMAZEPAM 30 MG PO CAPS: 30.0000 mg | ORAL_CAPSULE | Freq: Every evening | ORAL | 5 refills | Status: AC | PRN

## 2023-11-14 NOTE — Progress Notes (Signed)
   Subjective:    Patient ID: Mercedes [name], female    DOB: 1960/06/20, 63 y.o.   MRN: [id number]  HPI Here to follow up after a left knee total replacement surgery on 09-26-23 per Dr. Ernie. This went well and she is progressing with her PT on schedule. She now has little pain, and she is driving again. She uses a cane when she is out on a crowd of people to keep her balance. She asks for refills of Temazepam  to help with sleep.    Review of Systems  Constitutional: Negative.   Respiratory: Negative.    Cardiovascular: Negative.   Psychiatric/Behavioral:  Positive for sleep disturbance. Negative for dysphoric mood. The patient is not nervous/anxious.        Objective:   Physical Exam Constitutional:      Appearance: Normal appearance.  Cardiovascular:     Rate and Rhythm: Normal rate and regular rhythm.     Pulses: Normal pulses.     Heart sounds: Normal heart sounds.  Pulmonary:     Effort: Pulmonary effort is normal.     Breath sounds: Normal breath sounds.  Neurological:     General: No focal deficit present.     Mental Status: She is alert and oriented to person, place, and time.           Assessment & Plan:  She is recovering well from knee replacement surgery. For the insomnia, we will refill Temazepam  30 mg to use as needed.  Mercedes Olmsted, MD

## 2023-11-18 NOTE — Telephone Encounter (Signed)
 error

## 2023-11-22 ENCOUNTER — Ambulatory Visit: Admitting: Podiatry

## 2023-12-11 ENCOUNTER — Encounter: Payer: Self-pay | Admitting: Podiatry

## 2023-12-11 ENCOUNTER — Ambulatory Visit (INDEPENDENT_AMBULATORY_CARE_PROVIDER_SITE_OTHER): Admitting: Podiatry

## 2023-12-11 DIAGNOSIS — L539 Erythematous condition, unspecified: Secondary | ICD-10-CM | POA: Diagnosis not present

## 2023-12-11 DIAGNOSIS — L600 Ingrowing nail: Secondary | ICD-10-CM | POA: Diagnosis not present

## 2023-12-11 MED ORDER — DOXYCYCLINE HYCLATE 100 MG PO TABS: 100.0000 mg | ORAL_TABLET | Freq: Two times a day (BID) | ORAL | 0 refills | Status: DC

## 2023-12-11 NOTE — Patient Instructions (Signed)

## 2023-12-11 NOTE — Progress Notes (Signed)
 Subjective:  Patient ID: Mercedes [name], female    DOB: [birth year],  MRN: [id number]  Chief Complaint  Patient presents with   Nail Problem    63 y.o. female presents with the above complaint.  Patient presents for left hallux lateral border ingrown painful to touch is progressing and worse worse with ambulation and shoe pressure she would like to have it removed has not seen anyone as prior to seeing me.  She also has some erythema she is currently on an antibiotic she would like to discuss antibiotic treatment options as well.   Review of Systems: Negative except as noted in the HPI. Denies N/V/F/Ch.  Past Medical History:  Diagnosis Date   Allergy    Anemia    Glaucoma    sees Dr. Adine Haddock     Current Outpatient Medications:    cephALEXin (KEFLEX) 500 MG capsule, Take 1 capsule (500 mg total) by mouth 3 (three) times daily., Disp: 30 capsule, Rfl: 0   doxycycline (VIBRA-TABS) 100 MG tablet, Take 1 tablet (100 mg total) by mouth 2 (two) times daily., Disp: 20 tablet, Rfl: 0   acetaminophen (TYLENOL 8 HOUR) 650 MG CR tablet, Take 650 mg by mouth every 8 (eight) hours as needed for pain., Disp: , Rfl:    celecoxib  (CELEBREX ) 200 MG capsule, Take 200 mg by mouth 3 (three) times daily., Disp: , Rfl:    cetirizine (ZYRTEC) 10 MG tablet, Take by mouth., Disp: , Rfl:    Estradiol (DIVIGEL) 0.25 MG/0.25GM GEL, Divigel 1 mg/gram (0.1 %) transdermal gel packet  USE DAILY AS DIRECTED, Disp: , Rfl:    latanoprost  (XALATAN ) 0.005 % ophthalmic solution, Place 1 drop into both eyes at bedtime., Disp: 2.5 mL, Rfl: 12   methocarbamol  (ROBAXIN ) 500 MG tablet, Take 500 mg by mouth every 6 (six) hours as needed., Disp: , Rfl:    Multiple Vitamins-Minerals (CENTRUM SILVER ULTRA WOMENS PO), Take by mouth daily., Disp: , Rfl:    progesterone (PROMETRIUM) 100 MG capsule, Take 100 mg by mouth at bedtime., Disp: , Rfl: 12   temazepam  (RESTORIL ) 30 MG capsule, Take 1 capsule (30 mg total) by mouth at  bedtime as needed for sleep., Disp: 30 capsule, Rfl: 5  Social History   Tobacco Use  Smoking Status Never  Smokeless Tobacco Never    Allergies  Allergen Reactions   Penicillins     Per pt: allergic as child   Sulfamethoxazole-Trimethoprim Hives   Objective:  There were no vitals filed for this visit. There is no height or weight on file to calculate BMI. Constitutional Well developed. Well nourished.  Vascular Dorsalis pedis pulses palpable bilaterally. Posterior tibial pulses palpable bilaterally. Capillary refill normal to all digits.  No cyanosis or clubbing noted. Pedal hair growth normal.  Neurologic Normal speech. Oriented to person, place, and time. Epicritic sensation to light touch grossly present bilaterally.  Dermatologic Painful ingrowing nail at lateral nail borders of the hallux nail left.  Erythema noted around the ingrown site No other open wounds. No skin lesions.  Orthopedic: Normal joint ROM without pain or crepitus bilaterally. No visible deformities. No bony tenderness.   Radiographs: None Assessment:   1. Ingrown left big toenail   2. Erythema    Plan:  Patient was evaluated and treated and all questions answered.  Ingrown Nail, left -Patient elects to proceed with minor surgery to remove ingrown toenail removal today. Consent reviewed and signed by patient. -Ingrown nail excised. See procedure note. -Educated on  post-procedure care including soaking. Written instructions provided and reviewed. -Patient to follow up in 2 weeks for nail check. - Doxycycline was sent to the pharmacy for erythema  Procedure: Excision of Ingrown Toenail Location: Left 1st toe lateral nail borders. Anesthesia: Lidocaine 1% plain; 1.5 mL and Marcaine 0.5% plain; 1.5 mL, digital block. Skin Prep: Betadine. Dressing: Silvadene; telfa; dry, sterile, compression dressing. Technique: Following skin prep, the toe was exsanguinated and a tourniquet was secured at the  base of the toe. The affected nail border was freed, split with a nail splitter, and excised. Chemical matrixectomy was then performed with phenol and irrigated out with alcohol. The tourniquet was then removed and sterile dressing applied. Disposition: Patient tolerated procedure well. Patient to return in 2 weeks for follow-up.   No follow-ups on file.

## 2023-12-12 ENCOUNTER — Other Ambulatory Visit: Payer: Self-pay | Admitting: Podiatry

## 2023-12-12 MED ORDER — CEPHALEXIN 500 MG PO CAPS: 500.0000 mg | ORAL_CAPSULE | Freq: Three times a day (TID) | ORAL | 0 refills | Status: DC

## 2023-12-16 ENCOUNTER — Encounter: Payer: Self-pay | Admitting: Radiology

## 2023-12-19 DIAGNOSIS — M1711 Unilateral primary osteoarthritis, right knee: Secondary | ICD-10-CM | POA: Diagnosis not present

## 2023-12-26 DIAGNOSIS — M1711 Unilateral primary osteoarthritis, right knee: Secondary | ICD-10-CM | POA: Diagnosis not present

## 2024-01-02 DIAGNOSIS — M25561 Pain in right knee: Secondary | ICD-10-CM | POA: Diagnosis not present

## 2024-02-27 ENCOUNTER — Ambulatory Visit: Payer: Self-pay

## 2024-02-27 NOTE — Telephone Encounter (Addendum)
" °  3rd attempt, left vm to call office.   Reason for Triage: pt feels tired, woke up sneezing with runny nose. She doesn't have fever, aches or chills.   "

## 2024-03-18 ENCOUNTER — Ambulatory Visit: Admitting: Family Medicine

## 2024-03-18 ENCOUNTER — Encounter: Payer: Self-pay | Admitting: Family Medicine

## 2024-03-18 VITALS — BP 106/68 | HR 47 | Temp 98.4°F | Wt 150.2 lb

## 2024-03-18 DIAGNOSIS — T753XXA Motion sickness, initial encounter: Secondary | ICD-10-CM

## 2024-03-18 MED ORDER — SCOPOLAMINE 1 MG/3DAYS TD PT72: 1.0000 | MEDICATED_PATCH | TRANSDERMAL | 12 refills | Status: AC

## 2024-03-18 NOTE — Progress Notes (Signed)
" ° °  Subjective:    Patient ID: Mercedes [name], female    DOB: Jun 16, 1960, 64 y.o.   MRN: [id number]  HPI Here asking for sea sickness patches. She has never used these, but she is going on a cruise next month, and she heard they are a good idea. Otherwise she is well.    Review of Systems  Constitutional: Negative.   Respiratory: Negative.    Cardiovascular: Negative.        Objective:   Physical Exam Constitutional:      Appearance: Normal appearance.  Cardiovascular:     Rate and Rhythm: Normal rate and regular rhythm.     Pulses: Normal pulses.     Heart sounds: Normal heart sounds.  Pulmonary:     Effort: Pulmonary effort is normal.     Breath sounds: Normal breath sounds.  Neurological:     Mental Status: She is alert.           Assessment & Plan:  Sea sickness, she will use Scopolamine  patches as needed.  Garnette Olmsted, MD   "
